# Patient Record
Sex: Male | Born: 1955 | Race: White | Hispanic: No | Marital: Single | Smoking: Former smoker
Health system: Southern US, Community
[De-identification: ages and names within clinical notes are randomized; demographics above are authoritative.]

---

## 2003-03-14 ENCOUNTER — Encounter: Payer: Self-pay | Admitting: Emergency Medicine

## 2003-03-14 ENCOUNTER — Emergency Department (HOSPITAL_COMMUNITY): Admission: EM | Admit: 2003-03-14 | Discharge: 2003-03-14 | Payer: Self-pay | Admitting: Emergency Medicine

## 2009-12-05 ENCOUNTER — Emergency Department (HOSPITAL_COMMUNITY): Admission: EM | Admit: 2009-12-05 | Discharge: 2009-12-05 | Payer: Self-pay | Admitting: Emergency Medicine

## 2011-02-22 ENCOUNTER — Emergency Department (HOSPITAL_COMMUNITY)
Admission: EM | Admit: 2011-02-22 | Discharge: 2011-02-22 | Disposition: A | Discharge status: Home / Self Care | Payer: Self-pay | Attending: Emergency Medicine | Admitting: Emergency Medicine

## 2011-02-22 DIAGNOSIS — R300 Dysuria: Secondary | ICD-10-CM | POA: Insufficient documentation

## 2011-02-22 DIAGNOSIS — R350 Frequency of micturition: Secondary | ICD-10-CM | POA: Insufficient documentation

## 2011-02-22 DIAGNOSIS — N419 Inflammatory disease of prostate, unspecified: Secondary | ICD-10-CM | POA: Insufficient documentation

## 2011-02-22 LAB — URINALYSIS, ROUTINE W REFLEX MICROSCOPIC
Glucose, UA: NEGATIVE mg/dL
Ketones, ur: NEGATIVE mg/dL
Leukocytes, UA: NEGATIVE
Nitrite: NEGATIVE
Protein, ur: NEGATIVE mg/dL
Specific Gravity, Urine: 1.032 — ABNORMAL HIGH (ref 1.005–1.030)

## 2011-04-08 ENCOUNTER — Emergency Department: Payer: Self-pay | Admitting: Emergency Medicine

## 2011-08-03 ENCOUNTER — Emergency Department (HOSPITAL_COMMUNITY)
Admission: EM | Admit: 2011-08-03 | Discharge: 2011-08-03 | Disposition: A | Discharge status: Home / Self Care | Payer: Self-pay | Attending: Emergency Medicine | Admitting: Emergency Medicine

## 2011-08-03 ENCOUNTER — Encounter: Payer: Self-pay | Admitting: *Deleted

## 2011-08-03 DIAGNOSIS — R221 Localized swelling, mass and lump, neck: Secondary | ICD-10-CM | POA: Insufficient documentation

## 2011-08-03 DIAGNOSIS — K047 Periapical abscess without sinus: Secondary | ICD-10-CM | POA: Insufficient documentation

## 2011-08-03 DIAGNOSIS — K089 Disorder of teeth and supporting structures, unspecified: Secondary | ICD-10-CM | POA: Insufficient documentation

## 2011-08-03 DIAGNOSIS — R6884 Jaw pain: Secondary | ICD-10-CM | POA: Insufficient documentation

## 2011-08-03 DIAGNOSIS — R220 Localized swelling, mass and lump, head: Secondary | ICD-10-CM | POA: Insufficient documentation

## 2011-08-03 MED ORDER — IBUPROFEN 800 MG PO TABS: 800.0000 mg | ORAL_TABLET | Freq: Three times a day (TID) | ORAL | Status: AC

## 2011-08-03 MED ORDER — HYDROCODONE-ACETAMINOPHEN 7.5-500 MG/15ML PO SOLN: 7.5000 mL | Freq: Three times a day (TID) | ORAL | Status: AC | PRN

## 2011-08-03 MED ORDER — PENICILLIN V POTASSIUM 500 MG PO TABS: 500.0000 mg | ORAL_TABLET | Freq: Four times a day (QID) | ORAL | Status: AC

## 2011-08-03 NOTE — ED Notes (Signed)
The pt has had tooth pain  For one week

## 2011-08-03 NOTE — ED Provider Notes (Signed)
History     CSN: [id number] Arrival date & time: 08/03/2011  1:10 AM   First MD Initiated Contact with Patient 08/03/11 [phone]      Chief Complaint  Patient presents with  . Dental Pain    (Consider location/radiation/quality/duration/timing/severity/associated sxs/prior treatment) Patient is a 55 y.o. male presenting with tooth pain. The history is provided by the patient.  Dental PainThe primary symptoms include mouth pain. Primary symptoms do not include oral lesions, headaches, fever, shortness of breath, sore throat, angioedema or cough. The symptoms began 5 to 7 days ago. The symptoms are unchanged. The symptoms are new. The symptoms occur constantly.  Additional symptoms include: dental sensitivity to temperature, gum tenderness and jaw pain. Additional symptoms do not include: purulent gums, trismus, facial swelling, trouble swallowing, pain with swallowing, dry mouth, taste disturbance, smell disturbance, drooling, ear pain, hearing loss, nosebleeds, swollen glands and fatigue. Medical issues include: smoking.   pain is located left lower incisor. Sharp in quality. Moderate in severity. Constant with no alleviating factors. Aggravated by chewing. Does not have a dentist. Is a smoker  History reviewed. No pertinent past medical history.  History reviewed. No pertinent past surgical history.  History reviewed. No pertinent family history.  History  Substance Use Topics  . Smoking status: Current Everyday Smoker  . Smokeless tobacco: Not on file  . Alcohol Use: No      Review of Systems  Constitutional: Negative for fever, chills and fatigue.  HENT: Positive for dental problem. Negative for hearing loss, ear pain, nosebleeds, sore throat, facial swelling, drooling, trouble swallowing, neck pain and neck stiffness.   Eyes: Negative for pain.  Respiratory: Negative for cough and shortness of breath.   Cardiovascular: Negative for chest pain.  Gastrointestinal: Negative for  abdominal pain.  Genitourinary: Negative for dysuria.  Musculoskeletal: Negative for back pain.  Skin: Negative for rash.  Neurological: Negative for headaches.  All other systems reviewed and are negative.    Allergies  Review of patient's allergies indicates no known allergies.  Home Medications   Current Outpatient Rx  Name Route Sig Dispense Refill  . IBUPROFEN 200 MG PO TABS Oral Take 1,200 mg by mouth every 6 (six) hours as needed. Tooth aches       BP 122/72  Pulse 56  Temp(Src) 97.9 F (36.6 C) (Oral)  Resp 16  SpO2 99%  Physical Exam  Constitutional: He is oriented to person, place, and time. He appears well-developed and well-nourished.  HENT:  Head: Normocephalic and atraumatic.       Very poor dentition. Left lower incisor tenderness to palpation. No gingival fluctuance does have some residual swelling in that area. No trismus. No facial swelling or erythema  Eyes: Conjunctivae and EOM are normal. Pupils are equal, round, and reactive to light.  Neck: Trachea normal. Neck supple. No thyromegaly present.  Cardiovascular: Normal rate, regular rhythm, S1 normal, S2 normal and normal pulses.     No systolic murmur is present   No diastolic murmur is present  Pulses:      Radial pulses are 2+ on the right side, and 2+ on the left side.  Pulmonary/Chest: Effort normal and breath sounds normal. He has no wheezes. He has no rhonchi. He has no rales. He exhibits no tenderness.  Abdominal: Soft. Normal appearance and bowel sounds are normal. There is no tenderness. There is no CVA tenderness and negative Murphy's sign.  Musculoskeletal:       BLE:s Calves nontender, no cords or erythema,  negative Homans sign  Neurological: He is alert and oriented to person, place, and time. He has normal strength. No cranial nerve deficit or sensory deficit. GCS eye subscore is 4. GCS verbal subscore is 5. GCS motor subscore is 6.  Skin: Skin is warm and dry. No rash noted. He is not  diaphoretic.  Psychiatric: His speech is normal.       Cooperative and appropriate    ED Course  Dental Date/Time: 08/03/2011 3:06 AM Performed by: Sunnie Nielsen Authorized by: Sunnie Nielsen Consent: Verbal consent obtained. Risks and benefits: risks, benefits and alternatives were discussed Consent given by: patient Patient understanding: patient states understanding of the procedure being performed Patient consent: the patient's understanding of the procedure matches consent given Procedure consent: procedure consent matches procedure scheduled Patient identity confirmed: verbally with patient Time out: Immediately prior to procedure a "time out" was called to verify the correct patient, procedure, equipment, support staff and site/side marked as required. Local anesthesia used: yes Anesthesia: local infiltration Local anesthetic: bupivacaine 0.5% without epinephrine Anesthetic total: 2 ml Patient tolerance: Patient tolerated the procedure well with no immediate complications. Comments: Pain improved and patient is much better.   (including critical care time)    MDM   Dental pain likely abscess. Dental block as above. Patient tolerated well. Dental referral provided with prescription for antibiotics. NSAIDs and short course of narcotic pain medication as needed for severe pain        Sunnie Nielsen, MD 08/03/11 [phone]

## 2012-05-19 ENCOUNTER — Encounter (HOSPITAL_COMMUNITY): Payer: Self-pay

## 2012-05-19 ENCOUNTER — Emergency Department (HOSPITAL_COMMUNITY): Payer: Self-pay

## 2012-05-19 ENCOUNTER — Emergency Department (HOSPITAL_COMMUNITY)
Admission: EM | Admit: 2012-05-19 | Discharge: 2012-05-19 | Disposition: A | Discharge status: Home / Self Care | Payer: Self-pay | Attending: Emergency Medicine | Admitting: Emergency Medicine

## 2012-05-19 DIAGNOSIS — I498 Other specified cardiac arrhythmias: Secondary | ICD-10-CM | POA: Insufficient documentation

## 2012-05-19 DIAGNOSIS — M7989 Other specified soft tissue disorders: Secondary | ICD-10-CM

## 2012-05-19 DIAGNOSIS — R21 Rash and other nonspecific skin eruption: Secondary | ICD-10-CM | POA: Insufficient documentation

## 2012-05-19 DIAGNOSIS — R001 Bradycardia, unspecified: Secondary | ICD-10-CM

## 2012-05-19 LAB — BASIC METABOLIC PANEL
BUN: 14 mg/dL (ref 6–23)
Calcium: 9.3 mg/dL (ref 8.4–10.5)
Creatinine, Ser: 0.89 mg/dL (ref 0.50–1.35)
GFR calc Af Amer: >90 mL/min (ref >90)
GFR calc non Af Amer: >90 mL/min (ref >90)
Potassium: 3.9 mEq/L (ref 3.5–5.1)

## 2012-05-19 LAB — URINALYSIS, ROUTINE W REFLEX MICROSCOPIC
Bilirubin Urine: NEGATIVE
Hgb urine dipstick: NEGATIVE
Ketones, ur: NEGATIVE mg/dL
Protein, ur: NEGATIVE mg/dL
Urobilinogen, UA: 1 mg/dL (ref 0.0–1.0)

## 2012-05-19 LAB — CBC WITH DIFFERENTIAL/PLATELET
Basophils Relative: 1 % (ref 0–1)
Eosinophils Absolute: 0.5 10*3/uL (ref 0.0–0.7)
Eosinophils Relative: 5 % (ref 0–5)
Hemoglobin: 15.6 g/dL (ref 13.0–17.0)
MCH: 32.7 pg (ref 26.0–34.0)
MCHC: 35.5 g/dL (ref 30.0–36.0)
Monocytes Absolute: 0.7 10*3/uL (ref 0.1–1.0)
Monocytes Relative: 7 % (ref 3–12)
Neutrophils Relative %: 59 % (ref 43–77)

## 2012-05-19 MED ORDER — SULFAMETHOXAZOLE-TRIMETHOPRIM 800-160 MG PO TABS: 1.0000 | ORAL_TABLET | Freq: Two times a day (BID) | ORAL | Status: DC

## 2012-05-19 MED ORDER — CEPHALEXIN 500 MG PO CAPS: 500.0000 mg | ORAL_CAPSULE | Freq: Four times a day (QID) | ORAL | Status: DC

## 2012-05-19 NOTE — ED Notes (Signed)
Pt here for bilateral leg sweling ongoing for several weeks, and complains of sore ness.

## 2012-05-19 NOTE — Progress Notes (Signed)
*  PRELIMINARY RESULTS* Vascular Ultrasound Left lower extremity venous duplex has been completed.  Preliminary findings: no evidence of DVT or baker's cyst.  Farrel Demark, RDMS, RVT  05/19/2012, 10:56 AM

## 2012-05-19 NOTE — ED Provider Notes (Signed)
History     CSN: [id number]  Arrival date & time 05/19/12  0756   First MD Initiated Contact with Patient 05/19/12 [phone]      Chief Complaint  Patient presents with  . Leg Swelling    (Consider location/radiation/quality/duration/timing/severity/associated sxs/prior treatment) HPI Comments: Allen Fernandez is a 56 y.o. Male who has had bilateral leg itching and left leg swelling for several days. He believes he might of gotten bit by something. He works in [employer]rap[employer], [employer]. He denies fever, chest pain, weakness, shortness of breath, dizziness, nausea, or vomiting. No prior history of DVT. The patient in the left leg is worse with standing.  The history is provided by the patient.    No past medical history on file.  No past surgical history on file.  No family history on file.  History  Substance Use Topics  . Smoking status: Current Every Day Smoker  . Smokeless tobacco: Not on file  . Alcohol Use: No      Review of Systems  All other systems reviewed and are negative.    Allergies  Review of patient's allergies indicates no known allergies.  Home Medications   Current Outpatient Rx  Name Route Sig Dispense Refill  . IBUPROFEN 200 MG PO TABS Oral Take 1,200 mg by mouth every 6 (six) hours as needed. Tooth aches     . CEPHALEXIN 500 MG PO CAPS Oral Take 1 capsule (500 mg total) by mouth 4 (four) times daily. 28 capsule 0  . SULFAMETHOXAZOLE-TRIMETHOPRIM 800-160 MG PO TABS Oral Take 1 tablet by mouth 2 (two) times daily. One po bid x 7 days 14 tablet 0    BP 121/70  Pulse 40  Temp 97.8 F (36.6 C) (Oral)  Resp 18  SpO2 98%  Physical Exam  Nursing note and vitals reviewed. Constitutional: He is oriented to person, place, and time. He appears well-developed and well-nourished.  HENT:  Head: Normocephalic and atraumatic.  Right Ear: External ear normal.  Left Ear: External ear normal.  Eyes: Conjunctivae normal and EOM are normal. Pupils are equal, round, and  reactive to light.  Neck: Normal range of motion and phonation normal. Neck supple.  Cardiovascular: Normal rate, regular rhythm, normal heart sounds and intact distal pulses.   Pulmonary/Chest: Effort normal and breath sounds normal. He exhibits no bony tenderness.  Abdominal: Soft. Normal appearance. There is no tenderness.  Musculoskeletal: Normal range of motion.  Neurological: He is alert and oriented to person, place, and time. He has normal strength. No cranial nerve deficit or sensory deficit. He exhibits normal muscle tone. Coordination normal.  Skin: Skin is warm, dry and intact.       Excoriated rash anterior shins bilaterally. No distinct pattern. No vesicles, no fluctuant areas. No drainage  Psychiatric: He has a normal mood and affect. His behavior is normal. Judgment and thought content normal.    ED Course  Procedures (including critical care time)    Date: 05/19/2012  Rate: 40  Rhythm: sinus bradycardia  QRS Axis: normal  Intervals: normal  ST/T Wave abnormalities: normal  Conduction Disutrbances:none  Narrative Interpretation:   Old EKG Reviewed: none available  Ambulation trial: He tolerated well without dizziness, weakness, or significant pain.  Repeat vital signs are normal except persistent bradycardia  Vascular Doppler, negative for DVT, left leg  Labs Reviewed  CBC WITH DIFFERENTIAL - Abnormal; Notable for the following:    WBC 10.6 (*)     All other components within normal limits  BASIC METABOLIC PANEL  TROPONIN I  URINALYSIS, ROUTINE W REFLEX MICROSCOPIC   Dg Chest 2 View  05/19/2012  *RADIOLOGY REPORT*  Clinical Data: Bradycardia.  Leg pain.  CHEST - 2 VIEW  Comparison: None.  Findings: The heart size is normal.  Mild interstitial coarsening is likely chronic.  The lungs are slightly hyperinflated.  No focal airspace disease is evident.  Mild degenerative changes are present within the thoracic spine.  IMPRESSION:  1.  Mild hyperinflation  suggesting early changes of emphysema. 2.  Mild diffuse interstitial coarsening is likely chronic. 3.  No acute cardiopulmonary disease.   Original Report Authenticated By: Jamesetta Orleans. MATTERN, M.D.      1. Leg swelling   2. Rash   3. Bradycardia       MDM  Nonspecific rash, both legs and nonspecific left leg. Swelling. Doubt DVT, frank cellulitis, arterial insufficiency. He has incidental cardiac has been asymptomatic. He stable for discharge with outpatient management. We'll cover with antibiotics for possible secondary rash, infection     Plan: Home Medications- Keflex, Bactrim; Home Treatments- Wound care ; Recommended follow up- PCP of choice and Cardiology in 1-2 weeks       Flint Melter, MD 05/19/12 1658

## 2012-05-19 NOTE — ED Notes (Signed)
Ambulated Patient around nursing unit.  Patient did very well.  Stated he had no dizziness or light headiness.

## 2018-03-11 ENCOUNTER — Other Ambulatory Visit: Payer: Self-pay

## 2018-03-11 ENCOUNTER — Emergency Department (HOSPITAL_COMMUNITY)
Admission: EM | Admit: 2018-03-11 | Discharge: 2018-03-11 | Disposition: A | Discharge status: Home / Self Care | Payer: Medicaid Other | Attending: Emergency Medicine | Admitting: Emergency Medicine

## 2018-03-11 ENCOUNTER — Emergency Department (HOSPITAL_COMMUNITY): Payer: Medicaid Other

## 2018-03-11 ENCOUNTER — Encounter (HOSPITAL_COMMUNITY): Payer: Self-pay

## 2018-03-11 DIAGNOSIS — N503 Cyst of epididymis: Secondary | ICD-10-CM | POA: Diagnosis not present

## 2018-03-11 DIAGNOSIS — N50819 Testicular pain, unspecified: Secondary | ICD-10-CM | POA: Diagnosis present

## 2018-03-11 DIAGNOSIS — F17210 Nicotine dependence, cigarettes, uncomplicated: Secondary | ICD-10-CM | POA: Diagnosis not present

## 2018-03-11 DIAGNOSIS — Z79899 Other long term (current) drug therapy: Secondary | ICD-10-CM | POA: Diagnosis not present

## 2018-03-11 MED ORDER — IBUPROFEN 800 MG PO TABS: 800.0000 mg | ORAL_TABLET | Freq: Three times a day (TID) | ORAL | 0 refills | Status: DC | PRN

## 2018-03-11 NOTE — Discharge Instructions (Signed)
Follow-up with Dr. Liliane ShiWinter in 1 to 2 weeks for your testicular pain

## 2018-03-11 NOTE — ED Triage Notes (Signed)
Pt c/o swelling and pain to the testicles on and off " for a long time, like years " pt also reports slight swelling on the penis ; pt denies any discharge ;denies any difficulty urinating ; pt does report having a horse " bite his penis" when he was little

## 2018-03-11 NOTE — ED Provider Notes (Signed)
[facility]   CSN: [id number] Arrival date & time: 03/11/18  [id number]     History   Chief Complaint Chief Complaint  Patient presents with  . Testicle Pain    HPI Celedonio Miyamotoommy R Mcneice is a 62 y.o. male.  Patient complains of testicular pain for another weeks.  The history is provided by the patient. No language interpreter was used.  Testicle Pain  This is a new problem. The current episode started more than 1 week ago. The problem occurs constantly. The problem has not changed since onset.Pertinent negatives include no chest pain, no abdominal pain and no headaches. Nothing aggravates the symptoms. Nothing relieves the symptoms. He has tried nothing for the symptoms. The treatment provided no relief.    History reviewed. No pertinent past medical history.  There are no active problems to display for this patient.   History reviewed. No pertinent surgical history.      Home Medications    Prior to Admission medications   Medication Sig Start Date End Date Taking? Authorizing Provider  aspirin-acetaminophen-caffeine (EXCEDRIN MIGRAINE) [phone] MG tablet Take 2 tablets by mouth every 6 (six) hours as needed for headache.   Yes [provider]  Pramox-PE-Glycerin-Petrolatum (PREPARATION H) 1-0.25-14.4-15 % CREA Place 1 application rectally as needed (hemorrhoids).   Yes [provider]  ibuprofen (ADVIL,MOTRIN) 800 MG tablet Take 1 tablet (800 mg total) by mouth every 8 (eight) hours as needed. 03/11/18   Bethann BerkshireZammit, Maksymilian Mabey, MD    Family History No family history on file.  Social History Social History   Tobacco Use  . Smoking status: Current Every Day Smoker  . Smokeless tobacco: Never Used  Substance Use Topics  . Alcohol use: No  . Drug use: Never     Allergies   Patient has no known allergies.   Review of Systems Review of Systems  Constitutional: Negative for appetite change and fatigue.    HENT: Negative for congestion, ear discharge and sinus pressure.   Eyes: Negative for discharge.  Respiratory: Negative for cough.   Cardiovascular: Negative for chest pain.  Gastrointestinal: Negative for abdominal pain and diarrhea.  Genitourinary: Positive for testicular pain. Negative for frequency and hematuria.  Musculoskeletal: Negative for back pain.  Skin: Negative for rash.  Neurological: Negative for seizures and headaches.  Psychiatric/Behavioral: Negative for hallucinations.     Physical Exam Updated Vital Signs BP (!) 115/91   Pulse (!) 50   Temp 97.9 F (36.6 C) (Oral)   Resp 18   Ht 6\' 2"  (1.88 m)   Wt 95.3 kg (210 lb)   SpO2 98%   BMI 26.96 kg/m   Physical Exam  Constitutional: He is oriented to person, place, and time. He appears well-developed.  HENT:  Head: Normocephalic.  Eyes: Conjunctivae and EOM are normal. No scleral icterus.  Neck: Neck supple. No thyromegaly present.  Cardiovascular: Normal rate and regular rhythm. Exam reveals no gallop and no friction rub.  No murmur heard. Pulmonary/Chest: No stridor. He has no wheezes. He has no rales. He exhibits no tenderness.  Abdominal: He exhibits no distension. There is no tenderness. There is no rebound.  Genitourinary:  Genitourinary Comments: Minimal tenderness to his scrotum and testicle  Musculoskeletal: Normal range of motion. He exhibits no edema.  Lymphadenopathy:    He has no cervical adenopathy.  Neurological: He is oriented to person, place, and time. He exhibits normal muscle tone. Coordination normal.  Skin: No rash noted. No erythema.  Psychiatric: He has a normal mood and affect. His behavior is normal.     ED Treatments / Results  Labs (all labs ordered are listed, but only abnormal results are displayed) Labs Reviewed - No data to display  EKG None  Radiology US Scrotum  Result Date: 03/11/2018 CLINICAL DATA:  Testicular pain and swelling, chronic problem, intermittent.  EXAM: SCROTAL ULTRASOUND DOPPLER ULTRASOUND OF THE TESTICLES TECHNIQUE: Complete ultrasound examination of the testicles, epididymis, and other scrotal structures was performed. Color and spectral Doppler ultrasound were also utilized to evaluate blood flow to the testicles. COMPARISON:  None. FINDINGS: Right testicle Measurements: 4.8 x 2.5 x 3.4 cm. Normal sonographic appearance. No mass lesion. Normal color flow. Left testicle Measurements: 4.7 x 2.4 x 2.7 cm. Normal sonographic appearance. No mass lesion. Normal color flow. Right epididymis:  Normal in size and appearance. Left epididymis: Normal except for an insignificant 3 x 4 mm epididymal cyst. Hydrocele:  None visualized. Varicocele:  None visualized. IMPRESSION: Normal morphologic exam. No significant finding. The only irregularity at all is an insignificant 3 x 4 mm epididymal cyst on the left. Electronically Signed   By: Paulina Fusi M.D.   On: 03/11/2018 10:40    Procedures Procedures (including critical care time)  Medications Ordered in ED Medications - No data to display   Initial Impression / Assessment and Plan / ED Course  I have reviewed the triage vital signs and the nursing notes.  Pertinent labs & imaging results that were available during my care of the patient were reviewed by me and considered in my medical decision making (see chart for details).     Patient with testicular pain.  Exam essentially negative.  Ultrasound negative.  Patient will be given Motrin and referred to urology  Final Clinical Impressions(s) / ED Diagnoses   Final diagnoses:  Testicular pain    ED Discharge Orders        Ordered    ibuprofen (ADVIL,MOTRIN) 800 MG tablet  Every 8 hours PRN     03/11/18 1357       Bethann Berkshire, MD 03/11/18 1400

## 2018-07-15 ENCOUNTER — Other Ambulatory Visit: Payer: Self-pay

## 2018-07-15 ENCOUNTER — Encounter: Payer: Self-pay | Admitting: Family Medicine

## 2018-07-15 ENCOUNTER — Ambulatory Visit: Payer: Medicaid Other | Attending: Family Medicine | Admitting: Family Medicine

## 2018-07-15 VITALS — BP 155/69 | HR 41 | Temp 98.0°F | Resp 16 | Ht 74.0 in | Wt 219.4 lb

## 2018-07-15 DIAGNOSIS — F17210 Nicotine dependence, cigarettes, uncomplicated: Secondary | ICD-10-CM | POA: Insufficient documentation

## 2018-07-15 DIAGNOSIS — K649 Unspecified hemorrhoids: Secondary | ICD-10-CM | POA: Insufficient documentation

## 2018-07-15 DIAGNOSIS — R21 Rash and other nonspecific skin eruption: Secondary | ICD-10-CM

## 2018-07-15 DIAGNOSIS — Z833 Family history of diabetes mellitus: Secondary | ICD-10-CM | POA: Diagnosis not present

## 2018-07-15 DIAGNOSIS — R001 Bradycardia, unspecified: Secondary | ICD-10-CM | POA: Diagnosis not present

## 2018-07-15 DIAGNOSIS — Z720 Tobacco use: Secondary | ICD-10-CM | POA: Diagnosis not present

## 2018-07-15 DIAGNOSIS — Z8249 Family history of ischemic heart disease and other diseases of the circulatory system: Secondary | ICD-10-CM | POA: Diagnosis not present

## 2018-07-15 MED ORDER — PERMETHRIN 5 % EX CREA: TOPICAL_CREAM | CUTANEOUS | 4 refills | Status: DC

## 2018-07-15 NOTE — Progress Notes (Signed)
Subjective:    Patient ID: Allen Fernandez, male    DOB: 01-01-1956, 62 y.o.   MRN: [id number]  HPI 62 year old male new to the practice.  Patient presents with a chief complaint of an itchy rash to both lower legs that has been going on for several months.  Patient is not sure of the cause of the rash and he has had no improvement in the rash with the use of over-the-counter medications for rash/itching.  Patient reports no significant past medical history other than hemorrhoids.  Patient with no known drug allergies.  Patient reports no allergies to foods or insect stings.  Patient reports that he does smoke about 1 to 1-1/2 pack/day of cigarettes and he knows that he needs to stop but has not yet decided when he wants to quit.  Patient does not drink any alcohol.  Patient states that he is single.  Patient states that he is not working and is currently on disability. (Patient seemed to be unsure of exactly why he was on disability but thinks that is possibly related to back pain/joint pain but does not have any complaint of issues with back pain or joint pain at today's visit.).  Patient reports his family history is significant for having a sister with diabetes requiring insulin use.  Patient states that his father who is now deceased as well as 2 of his sisters also had hypertension.  Patient denies any family history of cancer, heart disease or strokes.      Upon questioning, patient states that the rash did seem to start at his feet or ankles and spread up his legs.  Patient states that the itching is worse at night.  Patient does report all is well that he spends a lot of his time doing yard work.  Patient states that he uses his riding lawnmower to cut the grass at his home as well as helping with his neighbors yards as well.  On review of systems, patient denies any headaches, dizziness or lightheadedness.  On vital signs, patient with pulse of 41 and patient denies that he is ever been told in the  past that he had a slow heart rate.  Patient reports no current over-the-counter or prescription medication use.  Patient denies any shortness of breath other than what he believes to be secondary to chronic cigarette smoking.  Patient has had no recent increase in fatigue.  Patient denies any issues with chest pain and no sensation of palpitations.  Patient has had no peripheral edema.    Review of Systems  Constitutional: Negative for chills, diaphoresis, fatigue and fever.  Respiratory: Negative for cough and shortness of breath.   Cardiovascular: Negative for chest pain, palpitations and leg swelling.  Gastrointestinal: Positive for rectal pain (Patient reports that he has had some recent increase and hemorrhoidal symptoms over the past 1 to 2 weeks). Negative for abdominal pain, blood in stool and constipation.  Genitourinary: Negative for dysuria, flank pain and frequency.  Musculoskeletal: Negative for back pain and gait problem.  Skin: Positive for rash. Negative for wound.  Neurological: Negative for dizziness, syncope, light-headedness, numbness and headaches.       Objective:   Physical Exam BP (!) 155/69   Pulse (!) 41   Temp 98 F (36.7 C) (Oral)   Resp 16   Ht 6\' 2"  (1.88 m)   Wt 219 lb 6.4 oz (99.5 kg)   SpO2 100%   BMI 28.17 kg/m Vital signs and nurse's  notes reviewed General- well-nourished well-developed older male in no acute distress Neck-supple, no lymphadenopathy, no thyromegaly, no carotid bruit Cardiovascular- regular rhythm but slow rate Lungs-clear to auscultation bilaterally Abdomen-soft, nontender Back-no CVA tenderness Extremities- patient with the presence of some varicose veins on the lower extremities left greater than right below the knee.  No edema Skin- patient with excoriations on the anterior shins and below the knees and behind the knees on the bilateral lower extremities.  Patient with some dry appearing somewhat linear papules on the bilateral  shins and back of the calves/knees     Assessment & Plan:  1. Rash and nonspecific skin eruption Patient with a rash on the bilateral lower extremities that appears to be consistent with scabies.  Treatment discussed with the patient and he is aware that he needs to apply the cream to the entire skin area, let dry and wash off in 8 hours.  Patient should then repeat application in 1 week.  Patient can take over-the-counter Benadryl if needed for itching.  Patient should call or return if the rash has not resolved after treatment x2 - permethrin (ELIMITE) 5 % cream; Apply cream to the entire skin, let dry and wash off after 8 hours.  Repeat application in 1 week  Dispense: 60 g; Refill: 4  2. Bradycardia Patient with bradycardia and patient had EKG which did also show bradycardia with heart rate of 42.  Patient denies any significant shortness of breath and reports no dizziness/lightheadedness or increased fatigue.  Patient will have BMP to look for electrolyte abnormality and TSH to look for thyroid disorder which may be contributing to his bradycardia.  Patient will be referred to cardiology for further evaluation and treatment.  Patient is currently asymptomatic. - Basic Metabolic Panel - TSH - Ambulatory referral to Cardiology  3. Tobacco use Discussed smoking cessation with the patient.  Discussed with patient that there are medications both prescription and over-the-counter that can help with smoking cessation.  Patient was also made aware that he may call to schedule an appointment with the clinical pharmacist to discuss smoking cessation and modalities to help with quitting tobacco use.  Also discussed [place] quit line.  Patient states that he will look into these things once he has decided when he would like to stop smoking.  *Influenza immunization offered but declined at today's visit  An After Visit Summary was printed and given to the patient.  Return in about 2 weeks (around 07/29/2018)  for heart rate and BP, rash in 2-3 weeks.

## 2018-07-16 LAB — BASIC METABOLIC PANEL WITH GFR
BUN/Creatinine Ratio: 17 (ref 10–24)
BUN: 16 mg/dL (ref 8–27)
CO2: 22 mmol/L (ref 20–29)
Calcium: 9.4 mg/dL (ref 8.6–10.2)
Chloride: 103 mmol/L (ref 96–106)
Creatinine, Ser: 0.93 mg/dL (ref 0.76–1.27)
GFR calc Af Amer: 103 mL/min/1.73
GFR calc non Af Amer: 89 mL/min/1.73
Glucose: 94 mg/dL (ref 65–99)
Potassium: 4.4 mmol/L (ref 3.5–5.2)
Sodium: 142 mmol/L (ref 134–144)

## 2018-07-16 LAB — TSH: TSH: 0.975 u[IU]/mL (ref 0.450–4.500)

## 2018-07-17 ENCOUNTER — Telehealth (INDEPENDENT_AMBULATORY_CARE_PROVIDER_SITE_OTHER): Payer: Self-pay

## 2018-07-17 NOTE — Telephone Encounter (Signed)
-----   Message from Cain Saupeammie Fulp, MD sent at 07/16/2018  4:05 PM EST ----- Notify patient of normal BMP and normal TSH

## 2018-07-17 NOTE — Telephone Encounter (Signed)
Patient has a voicemail that has not been set up yet. Will attempt to call once more before mailing results Perris Conwell Budd PalmerS Olon Russ, CMA

## 2018-07-29 ENCOUNTER — Ambulatory Visit: Payer: Medicaid Other | Admitting: Family Medicine

## 2018-08-12 ENCOUNTER — Ambulatory Visit: Payer: Medicaid Other | Admitting: Family Medicine

## 2019-03-12 DIAGNOSIS — T1501XA Foreign body in cornea, right eye, initial encounter: Secondary | ICD-10-CM | POA: Diagnosis not present

## 2019-03-16 IMAGING — US US SCROTUM
1 series · 14 of 25 positions shown · non-contrast
Comparison: None.

CLINICAL DATA: Testicular pain and swelling, chronic problem,
intermittent.

EXAM:
SCROTAL ULTRASOUND
DOPPLER ULTRASOUND OF THE TESTICLES
TECHNIQUE: Complete ultrasound examination of the testicles, epididymis, and
other scrotal structures was performed. Color and spectral Doppler
ultrasound were also utilized to evaluate blood flow to the
testicles.

[Series 1: us scrotum · 0.06mm/px · 14 of 42 slices shown]
[im 1/42]
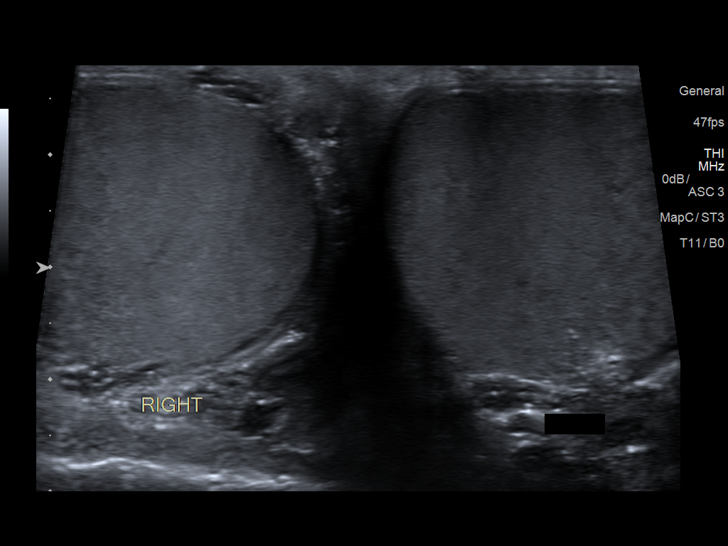
[im 4/42]
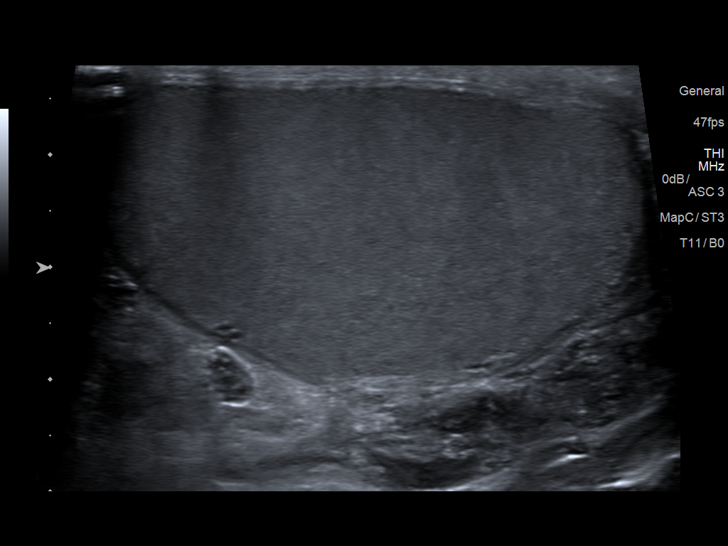
[im 7/42]
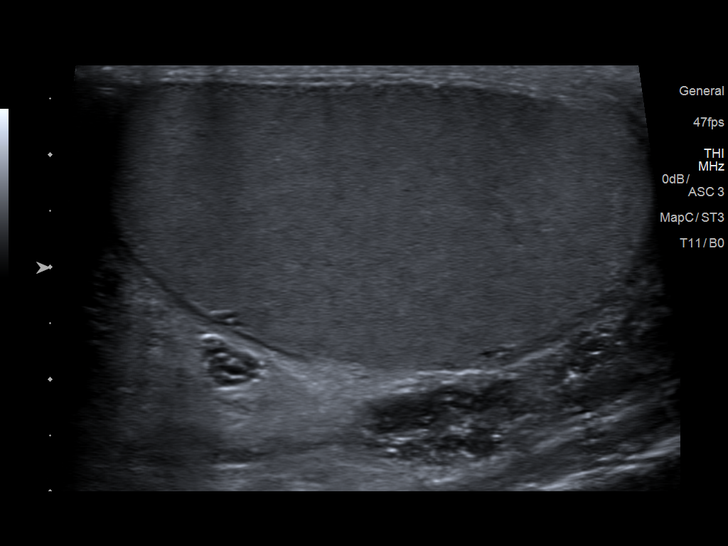
[im 11/42]
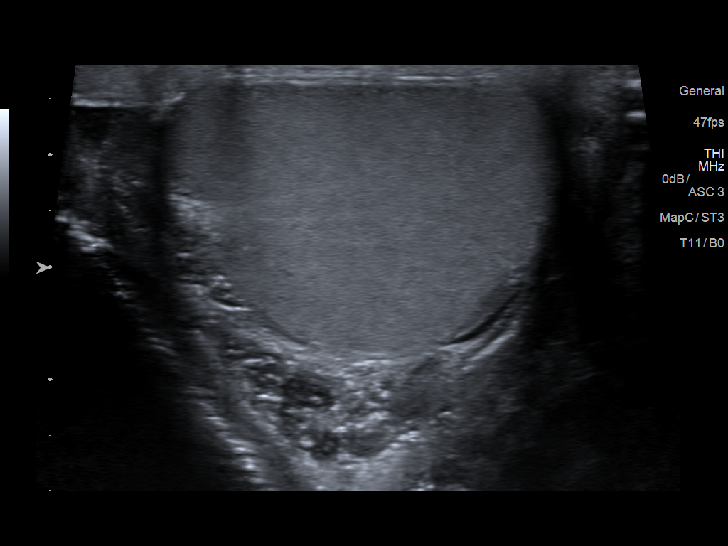
[im 14/42]
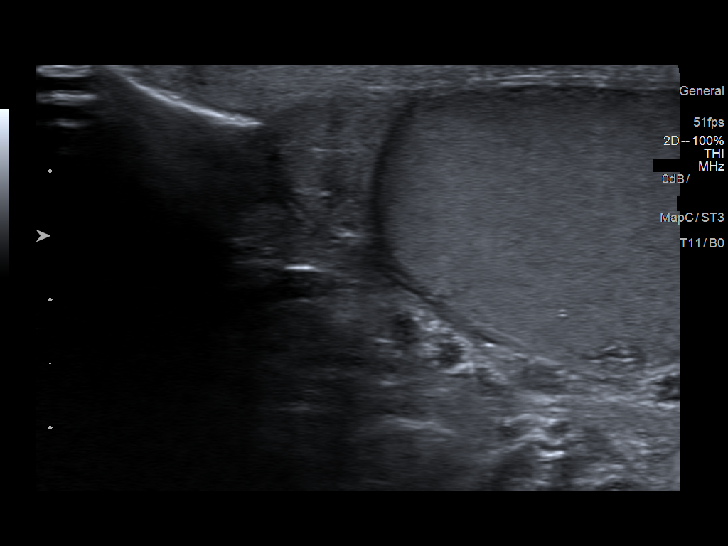
[im 16/42]
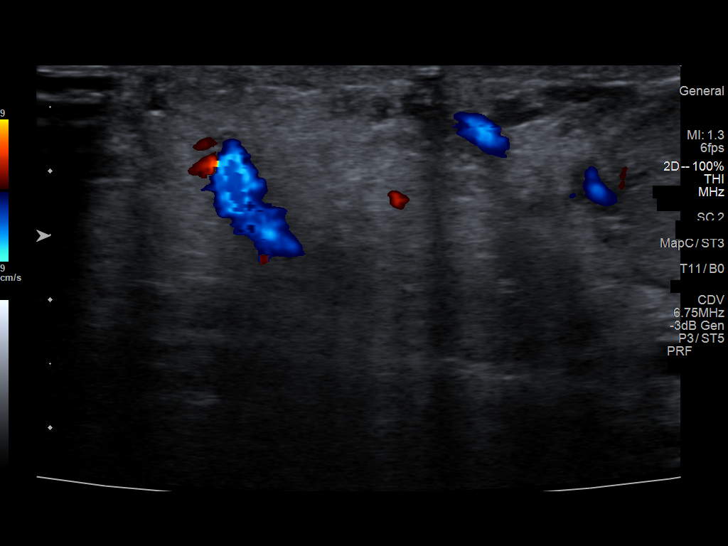
[im 19/42]
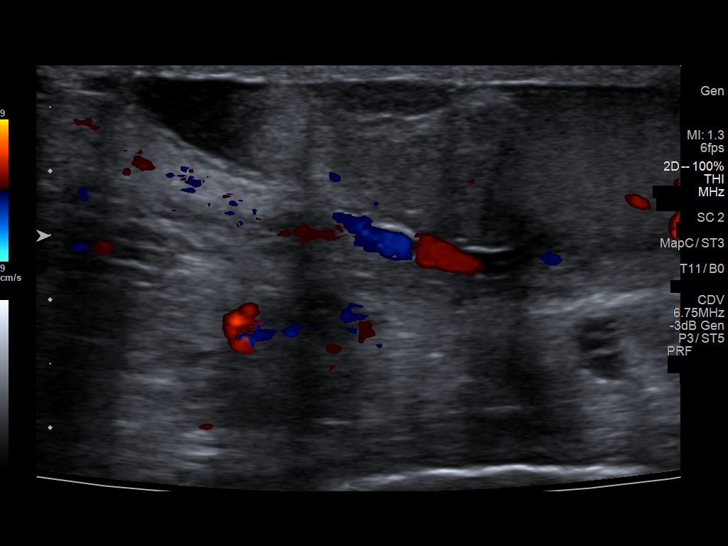
[im 23/42]
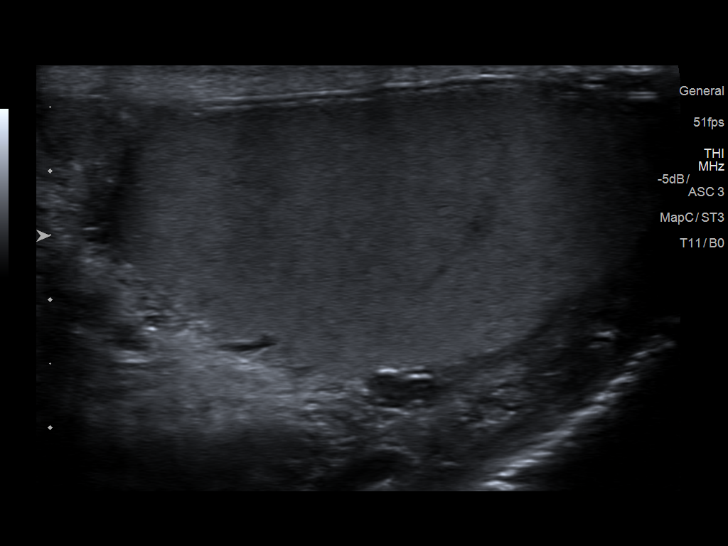
[im 26/42]
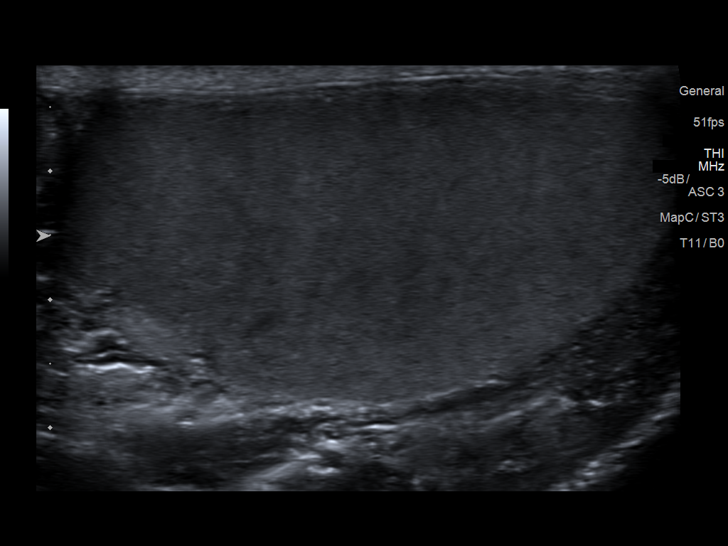
[im 28/42]
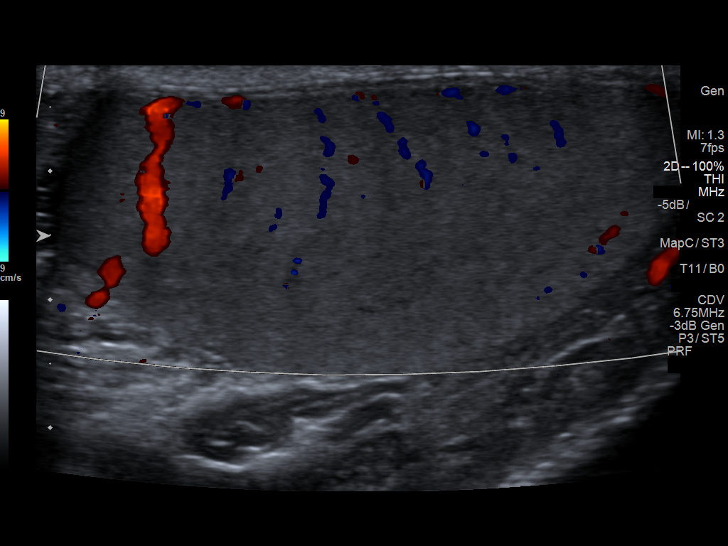
[im 31/42]
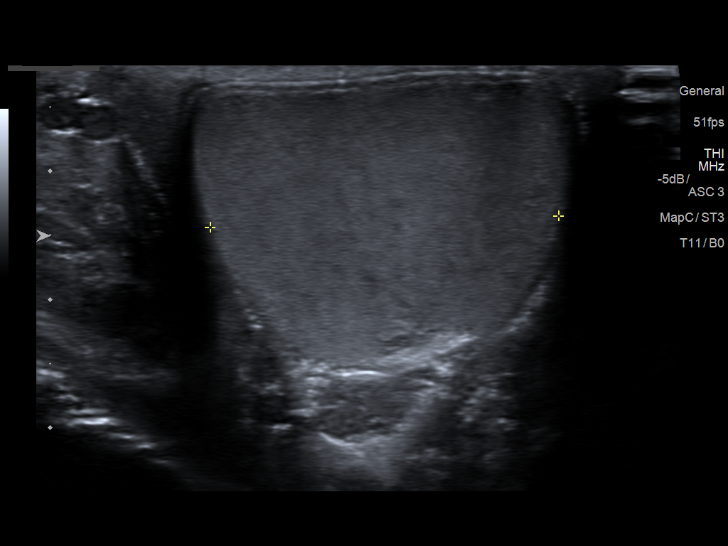
[im 35/42]
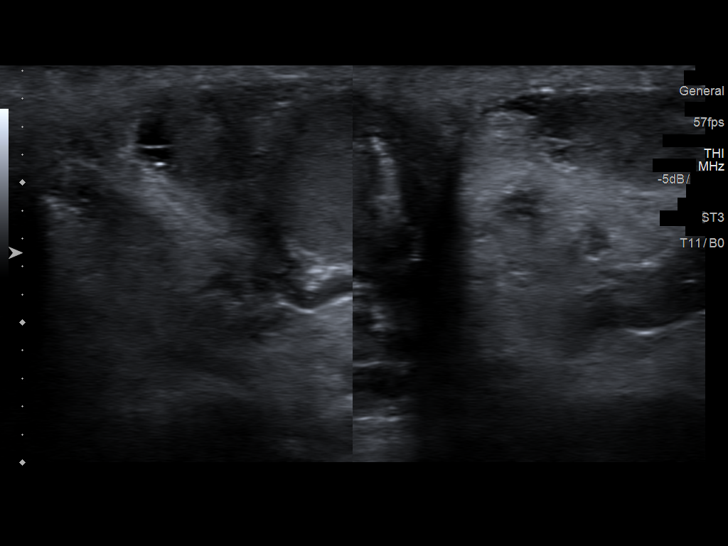
[im 38/42]
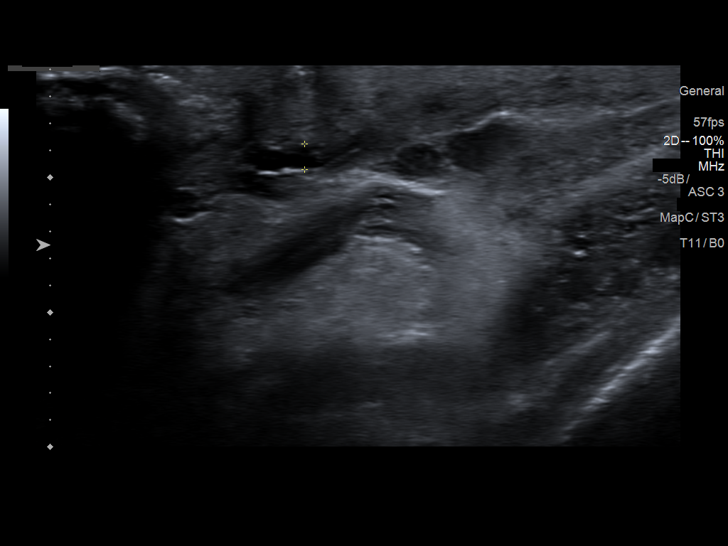
[im 42/42]
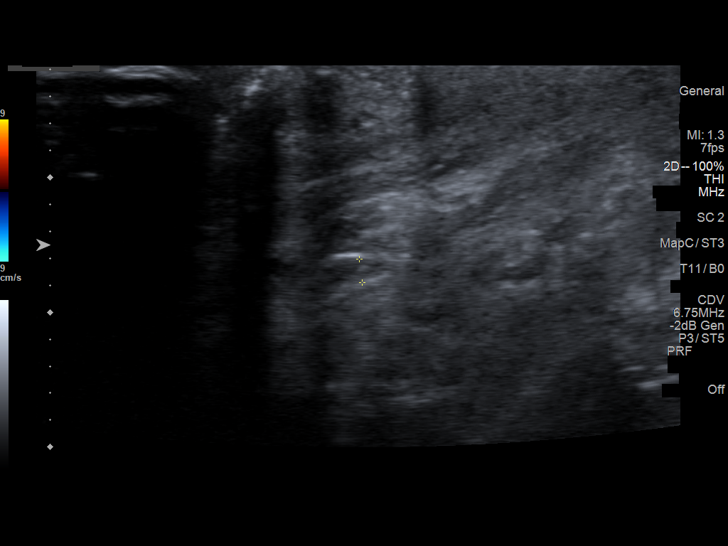

[14 of 25 positions shown; findings below may reference images not displayed]

FINDINGS: Right testicle

Measurements: 4.8 x 2.5 x 3.4 cm.. Normal sonographic appearance. No
mass lesion. Normal color flow.

Left testicle

Measurements: 4.7 x 2.4 x 2.7 cm.. Normal sonographic appearance. No
mass lesion. Normal color flow.

Right epididymis:  Normal in size and appearance.

Left epididymis: Normal except for an insignificant 3 x 4 mm
epididymal cyst.

Hydrocele:  None visualized.

Varicocele:  None visualized.
IMPRESSION: Normal morphologic exam. No significant finding. The only
irregularity at all is an insignificant 3 x 4 mm epididymal cyst on
the left.

## 2020-01-08 DIAGNOSIS — Z23 Encounter for immunization: Secondary | ICD-10-CM | POA: Diagnosis not present

## 2020-10-04 ENCOUNTER — Encounter: Payer: Self-pay | Admitting: Physician Assistant

## 2020-10-04 ENCOUNTER — Ambulatory Visit (INDEPENDENT_AMBULATORY_CARE_PROVIDER_SITE_OTHER): Payer: Medicaid Other | Admitting: Physician Assistant

## 2020-10-04 ENCOUNTER — Ambulatory Visit (INDEPENDENT_AMBULATORY_CARE_PROVIDER_SITE_OTHER): Payer: Medicaid Other

## 2020-10-04 DIAGNOSIS — M79672 Pain in left foot: Secondary | ICD-10-CM

## 2020-10-04 DIAGNOSIS — G8929 Other chronic pain: Secondary | ICD-10-CM

## 2020-10-04 NOTE — Progress Notes (Signed)
Office Visit Note   Patient: Allen Fernandez           Date of Birth: August 02, 1956           MRN: [id number] Visit Date: 10/04/2020              Requested by: No referring provider defined for this encounter. PCP: Pcp, No  No chief complaint on file.     HPI: Patient is a pleasant 65 year old gentleman with a 1 week history of left heel pain.  He denies any specific injury.  He thinks this was related to wearing a pair of shoes.  When he switched shoes it seems to get better.  He is currently wearing a pair of cowboy boots and that seems to make a big difference.  Pain is most acute in the morning sometimes when he first wakes up.  He has had no previous treatment  Assessment & Plan: Visit Diagnoses:  1. Heel pain, chronic, left     Plan: Findings consistent with plantar fasciitis.  I discussed the natural history of this as well as treatment options.  Since he seems to be doing better in his boots I continue to encourage him to wear them and perhaps add an arch support.  We also discussed Achilles stretching and I demonstrated weightbearing stretching to him as well as focus plantar fascia stretching.  I did briefly mention an injection but given this is been going on for short time I would like to try more conservative treatments first  Follow-Up Instructions: No follow-ups on file.   Ortho Exam  Patient is alert, oriented, no adenopathy, well-dressed, normal affect, normal respiratory effort. Left foot no swelling he has a strong dorsalis pedis pulse.  No tenderness with ankle range of motion though he does have some tightness in his Achilles.  No tenderness with subtalar motion.  He is focally tender over the insertion of the plantar fascia into the calcaneus.  No evidence of infection  Imaging: XR Foot Complete Left  Result Date: 10/04/2020 3 views of his foot demonstrate overall well-maintained alignment.  No acute bony changes he does have a large heel spur  No images are  attached to the encounter.  Labs: No results found for: HGBA1C, ESRSEDRATE, CRP, LABURIC, REPTSTATUS, GRAMSTAIN, CULT, LABORGA   No results found for: ALBUMIN, PREALBUMIN, LABURIC  No results found for: MG No results found for: VD25OH  No results found for: PREALBUMIN CBC EXTENDED Latest Ref Rng & Units 05/19/2012  WBC 4.0 - 10.5 K/uL 10.6(H)  RBC 4.22 - 5.81 MIL/uL 4.77  HGB 13.0 - 17.0 g/dL 16.9  HCT 67.8 - 93.8 % 44.0  PLT 150 - 400 K/uL 235  NEUTROABS 1.7 - 7.7 K/uL 6.1  LYMPHSABS 0.7 - 4.0 K/uL 2.9     There is no height or weight on file to calculate BMI.  Orders:  Orders Placed This Encounter  Procedures  . XR Foot Complete Left   No orders of the defined types were placed in this encounter.    Procedures: No procedures performed  Clinical Data: No additional findings.  ROS:  All other systems negative, except as noted in the HPI. Review of Systems  Objective: Vital Signs: There were no vitals taken for this visit.  Specialty Comments:  No specialty comments available.  PMFS History: Patient Active Problem List   Diagnosis Date Noted  . Hemorrhoids 07/15/2018  . Tobacco use 07/15/2018   No past medical history on file.  Family History  Problem Relation Age of Onset  . Hypertension Father   . Hypertension Sister   . Hypertension Sister   . Diabetes Sister     No past surgical history on file. Social History   Occupational History  . Not on file  Tobacco Use  . Smoking status: Current Every Day Smoker  . Smokeless tobacco: Never Used  Vaping Use  . Vaping Use: Never used  Substance and Sexual Activity  . Alcohol use: No  . Drug use: Never  . Sexual activity: Not Currently

## 2021-09-24 ENCOUNTER — Telehealth: Payer: Self-pay

## 2021-09-24 NOTE — Telephone Encounter (Signed)
Copied from CRM [id number]. Topic: General - Other >> Sep 24, 2021  1:10 PM Payton Spark N wrote: Reason for CRM: Pt called in stating the refer he had for his back pain, they are unable to see him until February and could not wait that long, if he could get another refer sent somewhere else. Please advise.

## 2021-09-26 ENCOUNTER — Other Ambulatory Visit: Payer: Self-pay

## 2021-09-26 ENCOUNTER — Encounter (HOSPITAL_COMMUNITY): Payer: Self-pay | Admitting: Emergency Medicine

## 2021-09-26 ENCOUNTER — Ambulatory Visit (HOSPITAL_COMMUNITY)
Admission: EM | Admit: 2021-09-26 | Discharge: 2021-09-26 | Disposition: A | Discharge status: Home / Self Care | Payer: Medicaid Other | Attending: Family Medicine | Admitting: Family Medicine

## 2021-09-26 DIAGNOSIS — M545 Low back pain, unspecified: Secondary | ICD-10-CM | POA: Diagnosis not present

## 2021-09-26 MED ORDER — PREDNISONE 10 MG PO TABS: ORAL_TABLET | ORAL | 0 refills | Status: AC

## 2021-09-26 NOTE — ED Triage Notes (Signed)
PT reports bilateral hip pain - burning sensation.  Lower back pain as well.  Denies injury.  Also notes he has diarrhea this AM, has gone 3 times.

## 2021-09-26 NOTE — Discharge Instructions (Addendum)
As we discussed, your back pain may be related to some arthritis changes and possible irritation of a disc or nerve.  We will treat you with a prednisone Dosepak.  Do not take any Advil, Aleve, ibuprofen while you are taking this.  I have also given you some stretches to do.  If any of the stretches cause worsening of your pain, do not do them.  If you are not improving over the next 1 to 2 weeks, please give the sports medicine office a call to schedule an appointment.  Stay well-hydrated with your diarrhea.  If this worsens over the next few days, especially if you are unable to tolerate anything by mouth, if you are urinating less than 50% of normal, or if you have very bad abdominal pain, you should be seen at the emergency room.  Go to the emergency room immediately if you have numbness and tingling in your groin, you are unable to urinate, you have new weakness on one side of your body or the other.  Go to [email or web address] to find a primary care provider.

## 2021-09-26 NOTE — ED Provider Notes (Signed)
[facility]    CSN: [id number] Arrival date & time: 09/26/21  1020      History   Chief Complaint Chief Complaint  Patient presents with   Hip Pain   Back Pain    HPI Allen Fernandez is a 66 y.o. male.   Back Pain/Bilateral Hip Pain Started a few days ago Taking ibuprofen without improvement No injury Hurts to lay down and to get up Radiates to bilateral hips and feels like a burning Doesn't feel tender to touch No N/T No saddle anesthesias, changes in bladder habits Is having diarrhea, but reports sphincter control No leg weakness No prior history of back pain  Diarrhea Started this AM Has gone 3 times so far Looks a little watery, no blood Urinating normally No abdominal pain No vomiting Otherwise feeling well Able to eat without difficulty No fevers   History reviewed. No pertinent past medical history.  Patient Active Problem List   Diagnosis Date Noted   Hemorrhoids 07/15/2018   Tobacco use 07/15/2018    History reviewed. No pertinent surgical history.     Home Medications    Prior to Admission medications   Medication Sig Start Date End Date Taking? Authorizing Provider  aspirin-acetaminophen-caffeine (EXCEDRIN MIGRAINE) [phone] MG tablet Take 2 tablets by mouth every 6 (six) hours as needed for headache.    [provider]  ibuprofen (ADVIL,MOTRIN) 800 MG tablet Take 1 tablet (800 mg total) by mouth every 8 (eight) hours as needed. 03/11/18   Bethann BerkshireZammit, Joseph, MD  permethrin (ELIMITE) 5 % cream Apply cream to the entire skin, let dry and wash off after 8 hours.  Repeat application in 1 week 07/15/18   Fulp, Cammie, MD  Pramox-PE-Glycerin-Petrolatum 1-0.25-14.4-15 % CREA Place 1 application rectally as needed (hemorrhoids).    [provider]    Family History Family History  Problem Relation Age of Onset   Hypertension Father    Hypertension Sister    Hypertension Sister    Diabetes Sister     Social  History Social History   Tobacco Use   Smoking status: Every Day   Smokeless tobacco: Never  Vaping Use   Vaping Use: Never used  Substance Use Topics   Alcohol use: No   Drug use: Never     Allergies   Patient has no known allergies.   Review of Systems Review of Systems  All other systems reviewed and are negative.  Per HPI Physical Exam Triage Vital Signs ED Triage Vitals  Enc Vitals Group     BP      Pulse      Resp      Temp      Temp src      SpO2      Weight      Height      Head Circumference      Peak Flow      Pain Score      Pain Loc      Pain Edu?      Excl. in GC?    No data found.  Updated Vital Signs BP (!) 143/69    Pulse 61    Temp 97.9 F (36.6 C) (Oral)    Resp 16    SpO2 97%   Visual Acuity Right Eye Distance:   Left Eye Distance:   Bilateral Distance:    Right Eye Near:   Left Eye Near:    Bilateral Near:     Physical Exam  Constitutional:      General: He is not in acute distress.    Appearance: Normal appearance. He is not ill-appearing.  HENT:     Head: Normocephalic and atraumatic.  Eyes:     Conjunctiva/sclera: Conjunctivae normal.  Cardiovascular:     Rate and Rhythm: Normal rate.  Pulmonary:     Effort: Pulmonary effort is normal. No respiratory distress.  Abdominal:     Palpations: Abdomen is soft.     Tenderness: There is no abdominal tenderness. There is no guarding.  Musculoskeletal:     Cervical back: Normal range of motion.     Comments: Lumbar spine: - Inspection: no gross deformity or asymmetry, swelling or ecchymosis - Palpation: No TTP over the spinous processes, paraspinal muscles, or SI joints b/l - ROM: full active ROM of the lumbar spine in flexion and extension with some pain with flexion - Strength: 5/5 strength of lower extremity in L4-S1 nerve root distributions b/l; normal gait - Neuro: sensation intact in the L4-S1 nerve root distribution b/l, 2+ L4 and S1 reflexes - Special testing:  positive slump b/l, Negative FABER   No pain with ROM of bilateral hips  Skin:    General: Skin is warm and dry.  Neurological:     Mental Status: He is alert and oriented to person, place, and time.  Psychiatric:        Mood and Affect: Mood normal.        Behavior: Behavior normal.     UC Treatments / Results  Labs (all labs ordered are listed, but only abnormal results are displayed) Labs Reviewed - No data to display  EKG   Radiology No results found.  Procedures Procedures (including critical care time)  Medications Ordered in UC Medications - No data to display  Initial Impression / Assessment and Plan / UC Course  I have reviewed the triage vital signs and the nursing notes.  Pertinent labs & imaging results that were available during my care of the patient were reviewed by me and considered in my medical decision making (see chart for details).     Acute low back pain without injury.  No red flags.  Can hold off on imaging in acute setting with no boney tenderness and no injury.  Will treat with prednisone dose pack.  Advised to take with food and watch for worsening of diarrhea with this.  Given McKenzie Extension exercises.  Advised f/u with sports medicine if not improving in 1-2 weeks.  Recommend hydration for diarrhea.  Benign abdominal exam and no concerning factors on history as just started today.  If unable to stay hydrated and urinating less than 50% of normal, recommended ED evaluation.  Given ED precautions, see AVS.   Final Clinical Impressions(s) / UC Diagnoses   Final diagnoses:  Acute bilateral low back pain without sciatica     Discharge Instructions      As we discussed, your back pain may be related to some arthritis changes and possible irritation of a disc or nerve.  We will treat you with a prednisone Dosepak.  Do not take any Advil, Aleve, ibuprofen while you are taking this.  I have also given you some stretches to do.  If any of the  stretches cause worsening of your pain, do not do them.  If you are not improving over the next 1 to 2 weeks, please give the sports medicine office a call to schedule an appointment.  Stay well-hydrated with your diarrhea.  If this worsens over the next few days, especially if you are unable to tolerate anything by mouth, if you are urinating less than 50% of normal, or if you have very bad abdominal pain, you should be seen at the emergency room.  Go to the emergency room immediately if you have numbness and tingling in your groin, you are unable to urinate, you have new weakness on one side of your body or the other.  Go to [email or web address] to find a primary care provider.     ED Prescriptions   None    PDMP not reviewed this encounter.   Klaus Casteneda, Solmon Ice, DO 09/26/21 1239

## 2021-09-30 ENCOUNTER — Encounter (HOSPITAL_COMMUNITY): Payer: Self-pay | Admitting: *Deleted

## 2021-09-30 ENCOUNTER — Other Ambulatory Visit: Payer: Self-pay

## 2021-09-30 ENCOUNTER — Emergency Department (HOSPITAL_COMMUNITY)
Admission: EM | Admit: 2021-09-30 | Discharge: 2021-09-30 | Disposition: A | Discharge status: Home / Self Care | Payer: Medicaid Other | Attending: Emergency Medicine | Admitting: Emergency Medicine

## 2021-09-30 DIAGNOSIS — Z79899 Other long term (current) drug therapy: Secondary | ICD-10-CM | POA: Diagnosis not present

## 2021-09-30 DIAGNOSIS — Z7982 Long term (current) use of aspirin: Secondary | ICD-10-CM | POA: Insufficient documentation

## 2021-09-30 DIAGNOSIS — M545 Low back pain, unspecified: Secondary | ICD-10-CM | POA: Diagnosis not present

## 2021-09-30 MED ORDER — KETOROLAC TROMETHAMINE 15 MG/ML IJ SOLN
15.0000 mg | Freq: Once | INTRAMUSCULAR | Status: AC
  Administered 2021-09-30: 15 mg via INTRAMUSCULAR
  Filled 2021-09-30: qty 1

## 2021-09-30 NOTE — Discharge Instructions (Signed)
Your back pain is most likely due to a muscular strain.  There is been a lot of research on back pain, unfortunately the only thing that seems to really help is Tylenol and ibuprofen.  Relative rest is also important to not lift greater than 10 pounds bending or twisting at the waist.  Please follow-up with your family physician.  The other thing that really seems to benefit patients is physical therapy which your doctor may send you for.  Please return to the emergency department for new numbness or weakness to your arms or legs. Difficulty with urinating or urinating or pooping on yourself.  Also if you cannot feel toilet paper when you wipe or get a fever.   You should not take the ibuprofen or naproxen until you have completed your steroids.  Make sure that you take the medicine with food.  Take 4 over the counter ibuprofen tablets 3 times a day or 2 over-the-counter naproxen tablets twice a day for pain. Also take tylenol 1000mg (2 extra strength) four times a day.

## 2021-09-30 NOTE — ED Provider Notes (Signed)
[facility] EMERGENCY DEPARTMENT Provider Note   CSN: [id number] Arrival date & time: 09/30/21  0856     History  Chief Complaint  Patient presents with   Back Pain    Allen Fernandez is a 66 y.o. male.  66 yo M with a chief complaints of low back pain.  This is been going on for about a week now.  Denies obvious trauma.  He thinks that his bed is gotten old and that is caused him to have the pain.  He denies fevers denies loss of bowel or bladder denies loss of rectal sensation denies numbness or weakness to the legs. Low back pain both sides, no radiation down the leg.  Worse with getting up.    The history is provided by the patient.  Back Pain     Home Medications Prior to Admission medications   Medication Sig Start Date End Date Taking? Authorizing Provider  aspirin-acetaminophen-caffeine (EXCEDRIN MIGRAINE) [phone] MG tablet Take 2 tablets by mouth every 6 (six) hours as needed for headache.    [provider]  ibuprofen (ADVIL,MOTRIN) 800 MG tablet Take 1 tablet (800 mg total) by mouth every 8 (eight) hours as needed. 03/11/18   Bethann Berkshire, MD  permethrin (ELIMITE) 5 % cream Apply cream to the entire skin, let dry and wash off after 8 hours.  Repeat application in 1 week 07/15/18   Fulp, Cammie, MD  Pramox-PE-Glycerin-Petrolatum 1-0.25-14.4-15 % CREA Place 1 application rectally as needed (hemorrhoids).    [provider]  predniSONE (DELTASONE) 10 MG tablet Take 6 tablets (60 mg total) by mouth daily for 1 day, THEN 5 tablets (50 mg total) daily for 1 day, THEN 4 tablets (40 mg total) daily for 1 day, THEN 3 tablets (30 mg total) daily for 1 day, THEN 2 tablets (20 mg total) daily for 1 day, THEN 1 tablet (10 mg total) daily for 1 day. 09/26/21 10/02/21  Meccariello, Solmon Ice, DO      Allergies    Patient has no known allergies.    Review of Systems   Review of Systems  Musculoskeletal:  Positive for back pain.   Physical  Exam Updated Vital Signs BP (!) 156/86 (BP Location: Right Arm)    Pulse (!) 49    Temp 97.7 F (36.5 C) (Temporal)    Resp 18    Ht 6\' 2"  (1.88 m)    Wt 136.1 kg    SpO2 100%    BMI 38.52 kg/m  Physical Exam Vitals and nursing note reviewed.  Constitutional:      Appearance: He is well-developed.  HENT:     Head: Normocephalic and atraumatic.  Eyes:     Pupils: Pupils are equal, round, and reactive to light.  Neck:     Vascular: No JVD.  Cardiovascular:     Rate and Rhythm: Normal rate and regular rhythm.     Heart sounds: No murmur heard.   No friction rub. No gallop.  Pulmonary:     Effort: No respiratory distress.     Breath sounds: No wheezing.  Abdominal:     General: There is no distension.     Tenderness: There is no abdominal tenderness. There is no guarding or rebound.  Musculoskeletal:        General: Tenderness present. Normal range of motion.     Cervical back: Normal range of motion and neck supple.     Comments: Mild low back pain.  PMS intact  to BLE.  Reflexes 2+ and equal.  No clonus. Skin:    Coloration: Skin is not pale.     Findings: No rash.  Neurological:     Mental Status: He is alert and oriented to person, place, and time.  Psychiatric:        Behavior: Behavior normal.    ED Results / Procedures / Treatments   Labs (all labs ordered are listed, but only abnormal results are displayed) Labs Reviewed - No data to display  EKG None  Radiology No results found.  Procedures Procedures    Medications Ordered in ED Medications  ketorolac (TORADOL) 15 MG/ML injection 15 mg (15 mg Intramuscular Given 09/30/21 0946)    ED Course/ Medical Decision Making/ A&P                           Medical Decision Making Risk Prescription drug management.   Patient is a 66 y.o. male with a cc of low back pain.  Going on for about a week.  Most likely musculoskeletal by history and physical.  He has no red flags.  Able to ambulate here without issue.   I reviewed the patient's medical record and he had a recent visit to urgent care for similar complaints.  He was started on prednisone.  Currently on steroids.  Will treat symptomatically.  Given sports medicine follow-up.  Return precautions discussed.  No current PCP..  10:09 AM:  I have discussed the diagnosis/risks/treatment options with the patient.  Evaluation and diagnostic testing in the emergency department does not suggest an emergent condition requiring admission or immediate intervention beyond what has been performed at this time.  They will follow up with  PCP, sports med. We also discussed returning to the ED immediately if new or worsening sx occur. We discussed the sx which are most concerning (e.g., sudden worsening pain, fever, inability to tolerate by mouth) that necessitate immediate return. Medications administered to the patient during their visit and any new prescriptions provided to the patient are listed below.  Medications given during this visit Medications  ketorolac (TORADOL) 15 MG/ML injection 15 mg (15 mg Intramuscular Given 09/30/21 0946)     The patient appears reasonably screen and/or stabilized for discharge and I doubt any other medical condition or other [facility] requiring further screening, evaluation, or treatment in the ED at this time prior to discharge.          Final Clinical Impression(s) / ED Diagnoses Final diagnoses:  Acute bilateral low back pain without sciatica    Rx / DC Orders ED Discharge Orders     None         Melene Plan, DO 09/30/21 1009

## 2021-09-30 NOTE — ED Triage Notes (Signed)
Patient presents to ed c/o lower back pain states the pain is much worse when he lays down. Onset 2-3 days ago. Denies radiation or tingling in arms or legs. Denies urinary sx.Patient is ambulatory.

## 2021-10-02 ENCOUNTER — Telehealth: Payer: Self-pay

## 2021-10-02 NOTE — Telephone Encounter (Signed)
Transition Care Management Follow-up Telephone Call Date of discharge and from where: 09/30/2021-[place]  How have you been since you were released from the hospital? Pt stated he is a little sore but will call to schedule appointment with PCP that was recommended  Any questions or concerns? No  Items Reviewed: Did the pt receive and understand the discharge instructions provided? Yes  Medications obtained and verified? Yes  Other? No  Any new allergies since your discharge? No  Dietary orders reviewed? No Do you have support at home? Yes   Home Care and Equipment/Supplies: Were home health services ordered? not applicable If so, what is the name of the agency? N/A  Has the agency set up a time to come to the patient's home? not applicable Were any new equipment or medical supplies ordered?  No What is the name of the medical supply agency? N/A Were you able to get the supplies/equipment? not applicable Do you have any questions related to the use of the equipment or supplies? No  PCP office was already recommended  Functional Questionnaire: (I = Independent and D = Dependent) ADLs: I  Bathing/Dressing- I  Meal Prep- I  Eating- I  Maintaining continence- I  Transferring/Ambulation- I  Managing Meds- I  Follow up appointments reviewed:  PCP Hospital f/u appt confirmed? No   Specialist Hospital f/u appt confirmed? No   Are transportation arrangements needed? No  If their condition worsens, is the pt aware to call PCP or go to the Emergency Dept.? Yes Was the patient provided with contact information for the PCP's office or ED? Yes Was to pt encouraged to call back with questions or concerns? Yes

## 2021-10-09 ENCOUNTER — Other Ambulatory Visit: Payer: Self-pay

## 2021-10-09 ENCOUNTER — Encounter: Payer: Self-pay | Admitting: Orthopaedic Surgery

## 2021-10-09 ENCOUNTER — Ambulatory Visit (INDEPENDENT_AMBULATORY_CARE_PROVIDER_SITE_OTHER): Payer: Medicaid Other | Admitting: Orthopaedic Surgery

## 2021-10-09 ENCOUNTER — Ambulatory Visit (INDEPENDENT_AMBULATORY_CARE_PROVIDER_SITE_OTHER): Payer: Medicaid Other

## 2021-10-09 VITALS — BP 158/85 | Ht 74.0 in | Wt 300.0 lb

## 2021-10-09 DIAGNOSIS — M545 Low back pain, unspecified: Secondary | ICD-10-CM | POA: Diagnosis not present

## 2021-10-09 DIAGNOSIS — G8929 Other chronic pain: Secondary | ICD-10-CM

## 2021-10-11 NOTE — Progress Notes (Signed)
Office Visit Note   Patient: Allen Fernandez           Date of Birth: 1955/09/11           MRN: [id number] Visit Date: 10/09/2021              Requested by: No referring provider defined for this encounter. PCP: Pcp, No   Assessment & Plan: Visit Diagnoses:  1. Chronic bilateral low back pain, unspecified whether sciatica present     Plan: X-ray images were reviewed he does not recall any injury that could have resulted in the T12 compression fracture.  He has been active ridden horses in the past .  We will set him up for some physical therapy for treatment of his acute low back pain.  Recheck 6 weeks.  Follow-Up Instructions: Return in about 6 weeks (around 11/20/2021).   Orders:  Orders Placed This Encounter  Procedures   XR Lumbar Spine 2-3 Views   Ambulatory referral to Physical Therapy   No orders of the defined types were placed in this encounter.     Procedures: No procedures performed   Clinical Data: No additional findings.   Subjective: Chief Complaint  Patient presents with   Lower Back - Pain    HPI 66 year old male seen with back pain.  He was treated in the emergency room 125 129 given prednisone.  He had pain across his lower back radiates into his buttocks.  Patient has severe pain if he gets from sitting to standing.  Once he is upright he does better.  He is used Tylenol and ibuprofen.  Pain does not radiate into his back.  Denies numbness or tingling.  Prednisone gave him some relief.  No associated bowel or bladder symptoms.  Review of Systems positive for smoking.  Negative for car injuries.  No history of significant falls.  All other systems noncontributory to HPI.   Objective: Vital Signs: BP (!) 158/85    Ht 6\' 2"  (1.88 m)    Wt 300 lb (136.1 kg)    BMI 38.52 kg/m   Physical Exam Constitutional:      Appearance: He is well-developed.  HENT:     Head: Normocephalic and atraumatic.     Right Ear: External ear normal.     Left Ear:  External ear normal.  Eyes:     Pupils: Pupils are equal, round, and reactive to light.  Neck:     Thyroid: No thyromegaly.     Trachea: No tracheal deviation.  Cardiovascular:     Rate and Rhythm: Normal rate.  Pulmonary:     Effort: Pulmonary effort is normal.     Breath sounds: No wheezing.  Abdominal:     General: Bowel sounds are normal.     Palpations: Abdomen is soft.  Musculoskeletal:     Cervical back: Neck supple.  Skin:    General: Skin is warm and dry.     Capillary Refill: Capillary refill takes less than 2 seconds.  Neurological:     Mental Status: He is alert and oriented to person, place, and time.  Psychiatric:        Behavior: Behavior normal.        Thought Content: Thought content normal.        Judgment: Judgment normal.    Ortho Exam  Specialty Comments:  No specialty comments available.  Imaging: P lateral lumbar spine images are obtained and reviewed.  This shows likely chronic T12 30% compression.  No spondylolisthesis in the lumbar spine.  Disc space is maintained.  Impression: Old T12 fracture otherwise unremarkable lumbar spine radiographs.   PMFS History: Patient Active Problem List   Diagnosis Date Noted   Hemorrhoids 07/15/2018   Tobacco use 07/15/2018   No past medical history on file.  Family History  Problem Relation Age of Onset   Hypertension Father    Hypertension Sister    Hypertension Sister    Diabetes Sister     No past surgical history on file. Social History   Occupational History   Not on file  Tobacco Use   Smoking status: Every Day   Smokeless tobacco: Never  Vaping Use   Vaping Use: Never used  Substance and Sexual Activity   Alcohol use: No   Drug use: Never   Sexual activity: Not Currently

## 2021-10-22 ENCOUNTER — Encounter: Payer: Self-pay | Admitting: Physical Therapy

## 2021-10-22 ENCOUNTER — Ambulatory Visit: Payer: Medicaid Other | Attending: Orthopaedic Surgery | Admitting: Physical Therapy

## 2021-10-22 ENCOUNTER — Other Ambulatory Visit: Payer: Self-pay

## 2021-10-22 DIAGNOSIS — M62830 Muscle spasm of back: Secondary | ICD-10-CM | POA: Diagnosis present

## 2021-10-22 DIAGNOSIS — G8929 Other chronic pain: Secondary | ICD-10-CM | POA: Insufficient documentation

## 2021-10-22 DIAGNOSIS — M545 Low back pain, unspecified: Secondary | ICD-10-CM | POA: Insufficient documentation

## 2021-10-22 NOTE — Therapy (Signed)
OUTPATIENT PHYSICAL THERAPY THORACOLUMBAR EVALUATION   Patient Name: Allen Fernandez MRN: [id number] DOB:[birth year], 66 y.o., male Today's Date: 10/22/2021   PT End of Session - 10/22/21 1417     Visit Number 1    Number of Visits 7    Date for PT Re-Evaluation 12/03/21    Authorization Type [employer] MCD    PT Start Time 1417    PT Stop Time 1455    PT Time Calculation (min) 38 min    Activity Tolerance Patient tolerated treatment well    Behavior During Therapy [facility] for tasks assessed/performed             History reviewed. No pertinent past medical history. History reviewed. No pertinent surgical history. Patient Active Problem List   Diagnosis Date Noted   Hemorrhoids 07/15/2018   Tobacco use 07/15/2018    PCP: Pcp, No  REFERRING PROVIDER: Eldred Manges, MD  REFERRING DIAG: Chronic bilateral low back pain, unspecified whether sciatica present [M54.50, G89.29]  THERAPY DIAG:  Chronic bilateral low back pain without sciatica  Muscle spasm of back  ONSET DATE: couple of weeks ago  SUBJECTIVE:                                                                                                                                                                                           SUBJECTIVE STATEMENT: Pt reports the pain started a couple of weeks ago, he was laying down and the back began to spasm with on specific cause. This referred pain to the hips on both sides along the outside. Since onset the pain has improved. He reportes the pain occurs with laying down and getting up.  PERTINENT HISTORY:  N/A  PAIN:  Are you having pain? Yes NPRS scale: 1/10 Pain location: bil lateral hip and low back Pain orientation: Right, Left, and Lower  PAIN TYPE: aching and burning Pain description: intermittent  Aggravating factors: getting into/ out of bed Relieving factors: take medication,   PRECAUTIONS: None  WEIGHT BEARING RESTRICTIONS No  FALLS:  Has patient fallen in  last 6 months? No, Number of falls: 0  LIVING ENVIRONMENT: Lives with: lives with their family Lives in: House/apartment Stairs: No;  Has following equipment at home: Single point cane and Environmental consultant - 2 wheeled  OCCUPATION: on distability   PLOF: Independent  PATIENT GOALS to get better   OBJECTIVE:  **MEASURES CAPTURED AT EVALUATION UNLESS OTHERWISE NOTED BY DATE** DIAGNOSTIC FINDINGS:     X-ray 10/09/2021 Impression: Old T12 fracture otherwise unremarkable lumbar spine  radiographs.   SCREENING FOR RED FLAGS: Bowel or bladder incontinence: No  COGNITION:  Overall cognitive status: Within functional limits for tasks assessed     SENSATION:  Light touch: Appears intact  POSTURE:  Forward head position  PALPATION: TTP along bil lumbar parapsinals, and R/ L QL  LUMBARAROM/PROM  A/PROM A/PROM  10/22/2021  Flexion 80  Extension 30  Right lateral flexion 30  Left lateral flexion 30  Right rotation   Left rotation    (Blank rows = not tested)  LE AROM/PROM:  A/PROM Right 10/22/2021 Left 10/22/2021  Hip flexion    Hip extension    Hip abduction    Hip adduction    Hip internal rotation    Hip external rotation    Knee flexion    Knee extension    Ankle dorsiflexion    Ankle plantarflexion    Ankle inversion    Ankle eversion     (Blank rows = not tested)  LE MMT:  MMT Right 10/22/2021 Left 10/22/2021  Hip flexion 4/5 * 4/5 *  Hip extension    Hip abduction 4+/5 4+/5  Hip adduction 4+/5 4+/5  Hip internal rotation    Hip external rotation    Knee flexion 5/5 5/5  Knee extension 5/5 5/5  Ankle dorsiflexion    Ankle plantarflexion    Ankle inversion    Ankle eversion     (Blank rows = not tested) (*= concordant pain noted)  LUMBAR SPECIAL TESTS:    FUNCTIONAL TESTS:  5 times sit to stand: 29 secs  GAIT: Distance walked:  Assistive device utilized: None Level of assistance: Complete Independence Comments: WFL    TODAY'S TREATMENT  [facility]  Adult PT Treatment:                                                DATE: 10/22/2021  Therapeutic Exercise: LTR 1 x 15 Supine posterior pelvic tilt 1 x 5 holding 10 seconds- tacitle cues for proper form Seated anterior pelvic tilt with abdominal draw in maneuver 1 x 5 - demonstration for proper form Therapeutic Activity: Log rolling for supine to sit transition     PATIENT EDUCATION:  Education details: evaluation findings, POC, goals, HEP with proper form. Person educated: Patient Education method: Explanation, Demonstration, Verbal cues, and Handouts Education comprehension: verbalized understanding, returned demonstration, and tactile cues required   HOME EXERCISE PROGRAM: Access Code: [email or web address] URL: [email or web address] Date: 10/22/2021 Prepared by: Lulu Riding  Exercises Lower Trunk Rotation - 1 x daily - 7 x weekly - 2 sets - 10 reps Seated Anterior Pelvic Tilt - 1 x daily - 7 x weekly - 2 sets - 10 reps - 5 seconds hold Supine Anterior Pelvic Tilt - 1 x daily - 7 x weekly - 2 sets - 10 reps Supine March - 1 x daily - 7 x weekly - 2 sets - 10 reps - 5 hold Sitting to Supine Roll - 1 x daily - 7 x weekly - 2 sets - 10 reps   ASSESSMENT:  CLINICAL IMPRESSION: Patient is a 66 y.o. M who was seen today for physical therapy evaluation and treatment for low back pain starting a couple of weeks ago with no specific onset or cause, and reports the pain is improving since onset. He has functional trunk mobility as well as strength with report of concordant pain noted with resisted hip flexion in sitting.  TTP along bil lumbar paraspinals  and QL that occurred with getting up from a seated position as well as going from supine to sit. He responded well in session with seated anterior pelvic tilt and rocking to help with sit to stand which greatly reduced his pain. He would benefit from physical therapy to reduce pain, improve general mobility and maximize his function  by addressing the deficits listed.     OBJECTIVE IMPAIRMENTS decreased activity tolerance, decreased balance, decreased strength, increased muscle spasms, improper body mechanics, and pain.   ACTIVITY LIMITATIONS  positioning  .   PERSONAL FACTORS Age are also affecting patient's functional outcome.    REHAB POTENTIAL: Good  CLINICAL DECISION MAKING: Stable/uncomplicated  EVALUATION COMPLEXITY: Low   GOALS: Goals reviewed with patient? Yes  SHORT TERM GOALS:  STG Name Target Date Goal status  1 Pt to be IND with initial HEP for therapeutic progression Baseline: no previous HEP 11/12/2021 INITIAL   LONG TERM GOALS:   LTG Name Target Date Goal status  1 Pt to be able to demo efficient posture and lifting mechanics to reduce abnormal tension in the low back Baseline: no previous knowledge of postre 12/03/2021 INITIAL  2 Pt to be able to perform general bed mobility via log roll and sit to stand biomechanics with </= 2/10 max pain for improvement in mobility / function Baseline: 12/03/2021 INITIAL  3 Pt to be able to perform 5 x STS scoring </= 18 seconds to demo improvement in function Baseline: 29 seconds on evaluation  12/03/2021 INITIAL  4 Pt to be IND with all HEP to be able to maintain and progress his current LOF IND Baseline: no previous HEP 12/03/2021 INITIAL   PLAN: PT FREQUENCY: 1-2x/week  PT DURATION: 6 weeks  PLANNED INTERVENTIONS: Therapeutic exercises, Therapeutic activity, Neuro Muscular re-education, Balance training, Gait training, Patient/Family education, Joint mobilization, Aquatic Therapy, Dry Needling, Electrical stimulation, Cryotherapy, Moist heat, Taping, Traction, Ultrasound, Ionotophoresis 4mg /ml Dexamethasone, and Manual therapy  PLAN FOR NEXT SESSION: Review/ update HEP PRN. Reivew posture and lifting mechanics, STW along lumbar paraspinals, core strengthening.    Deicy Rusk PT, DPT, LAT, ATC  10/22/21  3:03 PM      Check all possible  CPT codes: 10/24/21- Therapeutic Exercise, 97112- Neuro Re-education, 97140 - Manual Therapy, 97530 - Therapeutic Activities, 97535 - Self Care, [phone] - Mechanical traction, 97014 - Electrical stimulation (unattended), 11914 - Iontophoresis, 97035 - Ultrasound, [id number] - Physical performance training, and [id number] - Aquatic therapy

## 2021-10-29 ENCOUNTER — Ambulatory Visit: Payer: Medicaid Other | Admitting: Physical Therapy

## 2021-10-29 ENCOUNTER — Other Ambulatory Visit: Payer: Self-pay

## 2021-10-29 ENCOUNTER — Encounter: Payer: Self-pay | Admitting: Physical Therapy

## 2021-10-29 DIAGNOSIS — M545 Low back pain, unspecified: Secondary | ICD-10-CM | POA: Diagnosis not present

## 2021-10-29 DIAGNOSIS — M62830 Muscle spasm of back: Secondary | ICD-10-CM

## 2021-10-29 DIAGNOSIS — G8929 Other chronic pain: Secondary | ICD-10-CM

## 2021-10-29 NOTE — Therapy (Signed)
OUTPATIENT PHYSICAL THERAPY TREATMENT NOTE   Patient Name: Allen Fernandez MRN: [id number] DOB:01/08/1956, 66 y.o., male Today's Date: 10/29/2021  PCP: Merryl Hacker, No REFERRING PROVIDER: Marybelle Killings, MD   PT End of Session - 10/29/21 1028     Visit Number 2    Number of Visits 7    Date for PT Re-Evaluation 12/03/21    Authorization Type [employer] MCD    PT Start Time 1020    PT Stop Time 1100    PT Time Calculation (min) 40 min             History reviewed. No pertinent past medical history. History reviewed. No pertinent surgical history. Patient Active Problem List   Diagnosis Date Noted   Hemorrhoids 07/15/2018   Tobacco use 07/15/2018  PCP: Pcp, No   REFERRING PROVIDER: Marybelle Killings, MD   REFERRING DIAG: Chronic bilateral low back pain, unspecified whether sciatica present [M54.50, G89.29]  THERAPY DIAG:  Chronic bilateral low back pain without sciatica  Muscle spasm of back  PERTINENT HISTORY: N/A  PRECAUTIONS: none  SUBJECTIVE: I am doing a lot better. I am getting in and out of the bed now with no problem. I bend over a lot.   PAIN:  Are you having pain? No, just sore NPRS scale: 0/10 Pain location: bil lateral hip and low back Pain orientation: Right, Left, and Lower  PAIN TYPE: aching and burning Pain description: intermittent  Aggravating factors: getting into/ out of bed Relieving factors: take medication,   OBJECTIVE:  **MEASURES CAPTURED AT EVALUATION UNLESS OTHERWISE NOTED BY DATE** DIAGNOSTIC FINDINGS:     X-ray 10/09/2021 Impression: Old T12 fracture otherwise unremarkable lumbar spine  radiographs.     SCREENING FOR RED FLAGS: Bowel or bladder incontinence: No   COGNITION:          Overall cognitive status: Within functional limits for tasks assessed                        SENSATION:          Light touch: Appears intact   POSTURE:  Forward head position   PALPATION: TTP along bil lumbar parapsinals, and R/ L QL   LUMBARAROM/PROM    A/PROM A/PROM  10/22/2021  Flexion 80  Extension 30  Right lateral flexion 30  Left lateral flexion 30  Right rotation    Left rotation     (Blank rows = not tested)   LE AROM/PROM:   A/PROM Right 10/22/2021 Left 10/22/2021  Hip flexion      Hip extension      Hip abduction      Hip adduction      Hip internal rotation      Hip external rotation      Knee flexion      Knee extension      Ankle dorsiflexion      Ankle plantarflexion      Ankle inversion      Ankle eversion       (Blank rows = not tested)   LE MMT:   MMT Right 10/22/2021 Left 10/22/2021  Hip flexion 4/5 * 4/5 *  Hip extension      Hip abduction 4+/5 4+/5  Hip adduction 4+/5 4+/5  Hip internal rotation      Hip external rotation      Knee flexion 5/5 5/5  Knee extension 5/5 5/5  Ankle dorsiflexion      Ankle plantarflexion  Ankle inversion      Ankle eversion       (Blank rows = not tested) (*= concordant pain noted)   LUMBAR SPECIAL TESTS:      FUNCTIONAL TESTS:  5 times sit to stand: 29 secs   GAIT: Distance walked:  Assistive device utilized: None Level of assistance: Complete Independence Comments: WFL       TODAY'S TREATMENT  [facility] Adult PT Treatment:                                                DATE: 10/22/2021   Therapeutic Exercise: Nustep L6 x 5 minutes UE/LE Sit-stand x 10 from standard mat- min cues  LTR 1 x 15- mod cues  Supine anterior pelvic tilt per HEP Supine marching with abdominal brace Supine posterior pelvic tilt 5 sec x 10 Supine- small ROM bridge x 10  Single knee to chest 30 sec x 3 each  Seated lumbar flexion stretch  Therapeutic Activity: Log rolling for supine to sit transition Lifting 10# KB from 8inch step - max cues- kyphotic posture   [facility] Adult PT Treatment:                                                DATE: 10/22/2021   Therapeutic Exercise: LTR 1 x 15 Supine posterior pelvic tilt 1 x 5 holding 10 seconds- tacitle cues for proper  form Seated anterior pelvic tilt with abdominal draw in maneuver 1 x 5 - demonstration for proper form Therapeutic Activity: Log rolling for supine to sit transition         PATIENT EDUCATION:  Education details: evaluation findings, POC, goals, HEP with proper form. Person educated: Patient Education method: Explanation, Demonstration, Verbal cues, and Handouts Education comprehension: verbalized understanding, returned demonstration, and tactile cues required     HOME EXERCISE PROGRAM: Access Code: [email or web address] URL: [email or web address] Date: 10/29/2021 Prepared by: Hessie Diener  Exercises Lower Trunk Rotation - 1 x daily - 7 x weekly - 2 sets - 10 reps Seated Anterior Pelvic Tilt - 1 x daily - 7 x weekly - 2 sets - 10 reps - 5 seconds hold Supine Anterior Pelvic Tilt - 1 x daily - 7 x weekly - 2 sets - 10 reps Supine March - 1 x daily - 7 x weekly - 2 sets - 10 reps - 5 hold Sitting to Supine Roll - 1 x daily - 7 x weekly - 2 sets - 10 reps Hooklying Single Knee to Chest Stretch - 1 x daily - 7 x weekly - 1 sets - 3 reps - 30 hold Sit to stand with control - 1 x daily - 7 x weekly - 1 sets - 10 reps      ASSESSMENT:   CLINICAL IMPRESSION: Pt reports significant improvement since starting his HEP. He requires mod cues for HEP correctness. Progressed with sit-stand, bridge and SKTC. He has difficulty bending over to lift light kettle bell with good mechanics. He does better with sit-stand. He had increased pain with full bridge but was able to perform small ROM bridge comfortably. Upon sitting up he reported increased lumbar soreness. This was relieved by seated lumbar flexion stretching. Sit-stands and SKTC was added to  HEP today.      OBJECTIVE IMPAIRMENTS decreased activity tolerance, decreased balance, decreased strength, increased muscle spasms, improper body mechanics, and pain.    ACTIVITY LIMITATIONS  positioning  .    PERSONAL FACTORS Age are also  affecting patient's functional outcome.      REHAB POTENTIAL: Good   CLINICAL DECISION MAKING: Stable/uncomplicated   EVALUATION COMPLEXITY: Low     GOALS: Goals reviewed with patient? Yes   SHORT TERM GOALS:   STG Name Target Date Goal status  1 Pt to be IND with initial HEP for therapeutic progression Baseline: no previous HEP 11/12/2021 INITIAL    LONG TERM GOALS:    LTG Name Target Date Goal status  1 Pt to be able to demo efficient posture and lifting mechanics to reduce abnormal tension in the low back Baseline: no previous knowledge of postre 12/03/2021 INITIAL  2 Pt to be able to perform general bed mobility via log roll and sit to stand biomechanics with </= 2/10 max pain for improvement in mobility / function Baseline: 12/03/2021 INITIAL  3 Pt to be able to perform 5 x STS scoring </= 18 seconds to demo improvement in function Baseline: 29 seconds on evaluation   12/03/2021 INITIAL  4 Pt to be IND with all HEP to be able to maintain and progress his current LOF IND Baseline: no previous HEP 12/03/2021 INITIAL    PLAN: PT FREQUENCY: 1-2x/week   PT DURATION: 6 weeks   PLANNED INTERVENTIONS: Therapeutic exercises, Therapeutic activity, Neuro Muscular re-education, Balance training, Gait training, Patient/Family education, Joint mobilization, Aquatic Therapy, Dry Needling, Electrical stimulation, Cryotherapy, Moist heat, Taping, Traction, Ultrasound, Ionotophoresis 4mg /ml Dexamethasone, and Manual therapy   PLAN FOR NEXT SESSION: Review/ update HEP PRN. Reivew posture and lifting mechanics, STW along lumbar paraspinals, core strengthening.      Hessie Diener, PTA 10/29/21 12:05 PM Phone: [phone] Fax: [phone]

## 2021-11-05 ENCOUNTER — Ambulatory Visit: Payer: Medicaid Other | Attending: Orthopaedic Surgery | Admitting: Physical Therapy

## 2021-11-05 ENCOUNTER — Telehealth: Payer: Self-pay | Admitting: Physical Therapy

## 2021-11-05 NOTE — Telephone Encounter (Signed)
Attempted to contact patient regarding no -show to appointment today. His voicemail was not setup; unable to leave message.  ?

## 2021-11-12 ENCOUNTER — Telehealth: Payer: Self-pay | Admitting: Physical Therapy

## 2021-11-12 ENCOUNTER — Ambulatory Visit: Payer: Medicaid Other | Admitting: Physical Therapy

## 2021-11-12 NOTE — Telephone Encounter (Signed)
Attempted to contact patient regarding his second consecutive no-show to physical therapy. His voicemail was not set up. Due to clinic attendance policy, all future appointments are canceled.  ?

## 2021-11-19 ENCOUNTER — Encounter: Payer: Medicaid Other | Admitting: Physical Therapy

## 2021-11-20 ENCOUNTER — Ambulatory Visit: Payer: Medicaid Other | Admitting: Orthopaedic Surgery

## 2021-11-26 ENCOUNTER — Encounter: Payer: Medicaid Other | Admitting: Physical Therapy

## 2021-12-03 ENCOUNTER — Encounter: Payer: Medicaid Other | Admitting: Physical Therapy

## 2021-12-10 ENCOUNTER — Encounter: Payer: Medicaid Other | Admitting: Physical Therapy

## 2021-12-17 ENCOUNTER — Encounter: Payer: Medicaid Other | Admitting: Physical Therapy

## 2022-08-30 ENCOUNTER — Ambulatory Visit (HOSPITAL_COMMUNITY)
Admission: RE | Admit: 2022-08-30 | Discharge: 2022-08-30 | Discharge status: Against Medical Advice | Payer: Medicaid Other | Source: Ambulatory Visit

## 2024-01-08 ENCOUNTER — Ambulatory Visit (HOSPITAL_COMMUNITY)
Admission: RE | Admit: 2024-01-08 | Discharge: 2024-01-08 | Disposition: A | Discharge status: Home / Self Care | Source: Ambulatory Visit | Attending: Family Medicine | Admitting: Family Medicine

## 2024-01-08 ENCOUNTER — Encounter (HOSPITAL_COMMUNITY): Payer: Self-pay

## 2024-01-08 VITALS — BP 131/74 | HR 64 | Temp 98.1°F | Resp 16

## 2024-01-08 DIAGNOSIS — K649 Unspecified hemorrhoids: Secondary | ICD-10-CM

## 2024-01-08 MED ORDER — HYDROCORTISONE (PERIANAL) 2.5 % EX CREA: 1.0000 | TOPICAL_CREAM | Freq: Two times a day (BID) | CUTANEOUS | 0 refills | Status: DC

## 2024-01-08 NOTE — ED Triage Notes (Signed)
 Patient presenting with hemorrhoids onset 3 days ago. Ongoing for some time but flared up again recently. Denies any blood or painful bowel movements.  Prescriptions or OTC medications tried: Yes- otc hemorrhoids cream, Vaseline.    with moderate relief

## 2024-01-08 NOTE — Discharge Instructions (Signed)
 You were seen today for chronic hemorrhoids.  I have sent out a cream to use when ever you have a flare.  Please return as needed.

## 2024-01-08 NOTE — ED Provider Notes (Signed)
 [facility]    CSN: [id number] Arrival date & time: 01/08/24  0911      History   Chief Complaint Chief Complaint  Patient presents with   Appointment   Hemorrhoids    HPI Allen Fernandez is a 68 y.o. male.   Patient is here for a flare of hemorrhoids 3 days ago.  It actually feels better, but thought he should have them looked at.  He has had them a long time.   They are just sore at times.  No bleeding that his aware off.  He has used otc hemorrhoid cream without much help.        History reviewed. No pertinent past medical history.  Patient Active Problem List   Diagnosis Date Noted   Hemorrhoids 07/15/2018   Tobacco use 07/15/2018    History reviewed. No pertinent surgical history.     Home Medications    Prior to Admission medications   Medication Sig Start Date End Date Taking? Authorizing Provider  aspirin-acetaminophen -caffeine (EXCEDRIN MIGRAINE) 250-250-65 MG tablet Take 2 tablets by mouth every 6 (six) hours as needed for headache. Patient not taking: Reported on 10/22/2021    [provider]  ibuprofen  (ADVIL ,MOTRIN ) 800 MG tablet Take 1 tablet (800 mg total) by mouth every 8 (eight) hours as needed. 03/11/18   Zammit, Joseph, MD  naproxen sodium (ALEVE) 220 MG tablet Take 220 mg by mouth daily as needed.    [provider]  permethrin  (ELIMITE ) 5 % cream Apply cream to the entire skin, let dry and wash off after 8 hours.  Repeat application in 1 week Patient not taking: Reported on 10/22/2021 07/15/18   Berneda Bridges, MD  Pramox-PE-Glycerin-Petrolatum 1-0.25-14.4-15 % CREA Place 1 application rectally as needed (hemorrhoids). Patient not taking: Reported on 10/22/2021    [provider]    Family History Family History  Problem Relation Age of Onset   Hypertension Father    Hypertension Sister    Hypertension Sister    Diabetes Sister     Social History Social History   Tobacco Use   Smoking status: Every  Day   Smokeless tobacco: Never  Vaping Use   Vaping status: Never Used  Substance Use Topics   Alcohol use: No   Drug use: Never     Allergies   Patient has no known allergies.   Review of Systems Review of Systems  Constitutional: Negative.   HENT: Negative.    Respiratory: Negative.    Cardiovascular: Negative.   Gastrointestinal: Negative.      Physical Exam Triage Vital Signs ED Triage Vitals  Encounter Vitals Group     BP 01/08/24 0930 131/74     Systolic BP Percentile --      Diastolic BP Percentile --      Pulse Rate 01/08/24 0930 64     Resp 01/08/24 0930 16     Temp 01/08/24 0930 98.1 F (36.7 C)     Temp Source 01/08/24 0930 Oral     SpO2 01/08/24 0930 94 %     Weight --      Height --      Head Circumference --      Peak Flow --      Pain Score 01/08/24 0929 0     Pain Loc --      Pain Education --      Exclude from Growth Chart --    No data found.  Updated Vital Signs BP 131/74 (  BP Location: Right Arm)   Pulse 64   Temp 98.1 F (36.7 C) (Oral)   Resp 16   SpO2 94%   Visual Acuity Right Eye Distance:   Left Eye Distance:   Bilateral Distance:    Right Eye Near:   Left Eye Near:    Bilateral Near:     Physical Exam Constitutional:      Appearance: He is normal weight.  Cardiovascular:     Rate and Rhythm: Normal rate and regular rhythm.  Pulmonary:     Breath sounds: Normal breath sounds.  Genitourinary:    Comments: Patient declined chaperone.   He has a skin flap consistent with old hemorrhoid;  no active hemorrhoids currently.  No bleeding or tenderness noted.  Neurological:     General: No focal deficit present.     Mental Status: He is alert.  Psychiatric:        Mood and Affect: Mood normal.      UC Treatments / Results  Labs (all labs ordered are listed, but only abnormal results are displayed) Labs Reviewed - No data to display  EKG   Radiology No results found.  Procedures Procedures (including  critical care time)  Medications Ordered in UC Medications - No data to display  Initial Impression / Assessment and Plan / UC Course  I have reviewed the triage vital signs and the nursing notes.  Pertinent labs & imaging results that were available during my care of the patient were reviewed by me and considered in my medical decision making (see chart for details).  Final Clinical Impressions(s) / UC Diagnoses   Final diagnoses:  Hemorrhoids, unspecified hemorrhoid type     Discharge Instructions      You were seen today for chronic hemorrhoids.  I have sent out a cream to use when ever you have a flare.  Please return as needed.   ED Prescriptions     Medication Sig Dispense Auth. Provider   hydrocortisone (ANUSOL-HC) 2.5 % rectal cream Place 1 Application rectally 2 (two) times daily. 30 g Lesle Ras, MD      PDMP not reviewed this encounter.   Lesle Ras, MD 01/08/24 [phone]

## 2024-08-28 ENCOUNTER — Ambulatory Visit (HOSPITAL_COMMUNITY)
Admission: EM | Admit: 2024-08-28 | Discharge: 2024-08-28 | Disposition: A | Discharge status: Home / Self Care | Attending: Family Medicine | Admitting: Family Medicine

## 2024-08-28 ENCOUNTER — Ambulatory Visit (HOSPITAL_COMMUNITY)

## 2024-08-28 ENCOUNTER — Encounter (HOSPITAL_COMMUNITY): Payer: Self-pay

## 2024-08-28 DIAGNOSIS — R062 Wheezing: Secondary | ICD-10-CM | POA: Diagnosis not present

## 2024-08-28 DIAGNOSIS — J019 Acute sinusitis, unspecified: Secondary | ICD-10-CM | POA: Diagnosis not present

## 2024-08-28 DIAGNOSIS — J189 Pneumonia, unspecified organism: Secondary | ICD-10-CM | POA: Diagnosis not present

## 2024-08-28 DIAGNOSIS — J441 Chronic obstructive pulmonary disease with (acute) exacerbation: Secondary | ICD-10-CM

## 2024-08-28 MED ORDER — ALBUTEROL SULFATE HFA 108 (90 BASE) MCG/ACT IN AERS: 2.0000 | INHALATION_SPRAY | RESPIRATORY_TRACT | 0 refills | Status: AC | PRN

## 2024-08-28 MED ORDER — LEVOFLOXACIN 500 MG PO TABS: 500.0000 mg | ORAL_TABLET | Freq: Every day | ORAL | 0 refills | Status: AC

## 2024-08-28 MED ORDER — PREDNISONE 20 MG PO TABS: 40.0000 mg | ORAL_TABLET | Freq: Every day | ORAL | 0 refills | Status: AC

## 2024-08-28 MED ORDER — AEROCHAMBER PLUS FLO-VU MEDIUM MISC: 1.0000 | Freq: Once | Status: DC

## 2024-08-28 MED ORDER — ALBUTEROL SULFATE (2.5 MG/3ML) 0.083% IN NEBU
INHALATION_SOLUTION | RESPIRATORY_TRACT | Status: AC
  Filled 2024-08-28: qty 3

## 2024-08-28 MED ORDER — ALBUTEROL SULFATE (2.5 MG/3ML) 0.083% IN NEBU
2.5000 mg | INHALATION_SOLUTION | Freq: Once | RESPIRATORY_TRACT | Status: AC
  Administered 2024-08-28: 2.5 mg via RESPIRATORY_TRACT

## 2024-08-28 NOTE — ED Provider Notes (Addendum)
 " [facility]    CSN: [id number] Arrival date & time: 08/28/24  1017      History   Chief Complaint Chief Complaint  Patient presents with   Cough    HPI Allen Fernandez is a 68 y.o. male.    Cough  Here for nasal congestion and rhinorrhea and cough and chest congestion.  Symptoms for started about 2 weeks ago or possibly a little more.  He has had some subjective fever in the last few days.  No nausea or vomiting or diarrhea.  He has heard himself wheezing and has felt short of breath.  He has never been diagnosed with COPD or asthma and has never used an inhaler in the past.  NKDA  He is a former smoker   History reviewed. No pertinent past medical history.  Patient Active Problem List   Diagnosis Date Noted   Hemorrhoids 07/15/2018   Tobacco use 07/15/2018    History reviewed. No pertinent surgical history.     Home Medications    Prior to Admission medications  Medication Sig Start Date End Date Taking? Authorizing Provider  albuterol  (VENTOLIN  HFA) 108 (90 Base) MCG/ACT inhaler Inhale 2 puffs into the lungs every 4 (four) hours as needed for wheezing or shortness of breath. 08/28/24  Yes Vonna Sharlet POUR, MD  levofloxacin  (LEVAQUIN ) 500 MG tablet Take 1 tablet (500 mg total) by mouth daily for 7 days. 08/28/24 09/04/24 Yes Vonna Sharlet POUR, MD  predniSONE  (DELTASONE ) 20 MG tablet Take 2 tablets (40 mg total) by mouth daily with breakfast for 5 days. 08/28/24 09/02/24 Yes Vonna Sharlet POUR, MD    Family History Family History  Problem Relation Age of Onset   Hypertension Father    Hypertension Sister    Hypertension Sister    Diabetes Sister     Social History Social History[1]   Allergies   Patient has no known allergies.   Review of Systems Review of Systems  Respiratory:  Positive for cough.      Physical Exam Triage Vital Signs ED Triage Vitals  Encounter Vitals Group     BP 08/28/24 1324 (!) 153/79     Girls Systolic  BP Percentile --      Girls Diastolic BP Percentile --      Boys Systolic BP Percentile --      Boys Diastolic BP Percentile --      Pulse Rate 08/28/24 1324 66     Resp 08/28/24 1324 20     Temp 08/28/24 1324 97.9 F (36.6 C)     Temp Source 08/28/24 1324 Oral     SpO2 08/28/24 1324 92 %     Weight --      Height 08/28/24 1323 6' 2 (1.88 m)     Head Circumference --      Peak Flow --      Pain Score 08/28/24 1323 0     Pain Loc --      Pain Education --      Exclude from Growth Chart --    No data found.  Updated Vital Signs BP (!) 153/79 (BP Location: Left Arm)   Pulse 66   Temp 97.9 F (36.6 C) (Oral)   Resp 20   Ht 6' 2 (1.88 m)   SpO2 92%   BMI 38.52 kg/m   Visual Acuity Right Eye Distance:   Left Eye Distance:   Bilateral Distance:    Right Eye Near:   Left Eye  Near:    Bilateral Near:     Physical Exam Vitals (O2 sat 92% on RA) reviewed.  Constitutional:      General: He is not in acute distress.    Appearance: He is not ill-appearing, toxic-appearing or diaphoretic.  HENT:     Nose: Congestion present.     Mouth/Throat:     Mouth: Mucous membranes are moist.     Comments: There is some clear exudate in the oropharynx.  No erythema Eyes:     Extraocular Movements: Extraocular movements intact.     Conjunctiva/sclera: Conjunctivae normal.     Pupils: Pupils are equal, round, and reactive to light.  Cardiovascular:     Rate and Rhythm: Normal rate and regular rhythm.     Heart sounds: No murmur heard. Pulmonary:     Effort: No respiratory distress.     Breath sounds: No stridor. No rhonchi or rales.     Comments: There are bilateral wheezes noted, with good air movement. Musculoskeletal:     Cervical back: Neck supple.  Lymphadenopathy:     Cervical: No cervical adenopathy.  Skin:    Capillary Refill: Capillary refill takes less than 2 seconds.     Coloration: Skin is not jaundiced or pale.  Neurological:     General: No focal deficit  present.     Mental Status: He is alert and oriented to person, place, and time.  Psychiatric:        Behavior: Behavior normal.      UC Treatments / Results  Labs (all labs ordered are listed, but only abnormal results are displayed) Labs Reviewed - No data to display  EKG   Radiology No results found.  Procedures Procedures (including critical care time)  Medications Ordered in UC Medications  AeroChamber Plus Flo-Vu Medium MISC 1 each (has no administration in time range)  albuterol  (PROVENTIL ) (2.5 MG/3ML) 0.083% nebulizer solution 2.5 mg (2.5 mg Nebulization Given 08/28/24 1347)    Initial Impression / Assessment and Plan / UC Course  I have reviewed the triage vital signs and the nursing notes.  Pertinent labs & imaging results that were available during my care of the patient were reviewed by me and considered in my medical decision making (see chart for details).      He received a nebulizer treatment with albuterol .  He got some good symptom relief from the treatment and his lungs are clear afterward.  Also his O2 sat is very slightly improved to 93 or 94% from where it was before the treatment at 92%.  By my review there is a left lower lobe infiltrate, especially when compared to chest x-ray from several years ago.  He is advised of radiology over read.  Even if the radiology report does not show pneumonia as I think there is, I think he should still take the antibiotics since he has at least a sinus infection.  Levaquin  is sent in along with prednisone  and an albuterol  inhaler.  Spacer was ordered, but we are out of adult spacers for the inhalers.  Orders canceled Final Clinical Impressions(s) / UC Diagnoses   Final diagnoses:  Wheezing  Community acquired pneumonia of left lower lobe of lung  Acute sinusitis, recurrence not specified, unspecified location  COPD exacerbation (HCC)     Discharge Instructions      By my review there is a possible  pneumonia in your left lower lung. The radiologist will also read your x-ray, and if their interpretation differs significantly  from mine, and the management of your condition would change, we will call you.  Even if the radiologist does not see a pneumonia on the x-ray on the report, I think he should finish the antibiotic for sinus infection.  Albuterol  inhaler--do 2 puffs every 4 hours as needed for shortness of breath or wheezing; the medicine and the nebulizer treatment we gave you was the same as the medicine  Take prednisone  20 mg--2 daily for 5 days; this is for inflammation in your lungs  Levaquin  500 mg--take 1 tablet daily for 7 days; this is the antibiotic  I am glad you quit smoking.  Follow-up with your primary care provider  Also Mucinex dm can help for the congestion and cough       ED Prescriptions     Medication Sig Dispense Auth. Provider   albuterol  (VENTOLIN  HFA) 108 (90 Base) MCG/ACT inhaler Inhale 2 puffs into the lungs every 4 (four) hours as needed for wheezing or shortness of breath. 1 each Vonna Sharlet POUR, MD   levofloxacin  (LEVAQUIN ) 500 MG tablet Take 1 tablet (500 mg total) by mouth daily for 7 days. 7 tablet Khalifa Knecht K, MD   predniSONE  (DELTASONE ) 20 MG tablet Take 2 tablets (40 mg total) by mouth daily with breakfast for 5 days. 10 tablet Vonna Tige Meas K, MD      PDMP not reviewed this encounter.    Vonna Sharlet POUR, MD 08/28/24 1429     [1]  Social History Tobacco Use   Smoking status: Former    Types: Cigarettes   Smokeless tobacco: Never  Vaping Use   Vaping status: Never Used  Substance Use Topics   Alcohol use: No   Drug use: Never     Vonna Sharlet POUR, MD 08/28/24 1441  "

## 2024-08-28 NOTE — ED Triage Notes (Signed)
 SOB, Cough, Sneezing onset weeks. Has also been getting cramps and knots in his sides with movement. No known sick exposure. Denies history of respiratory issues.   Has tried Nyquil, Dayquil, and Advil  with no relief.

## 2024-08-28 NOTE — Discharge Instructions (Addendum)
 By my review there is a possible pneumonia in your left lower lung. The radiologist will also read your x-ray, and if their interpretation differs significantly from mine, and the management of your condition would change, we will call you.  Even if the radiologist does not see a pneumonia on the x-ray on the report, I think he should finish the antibiotic for sinus infection.  Albuterol  inhaler--do 2 puffs every 4 hours as needed for shortness of breath or wheezing; the medicine and the nebulizer treatment we gave you was the same as the medicine  Take prednisone  20 mg--2 daily for 5 days; this is for inflammation in your lungs  Levaquin  500 mg--take 1 tablet daily for 7 days; this is the antibiotic  I am glad you quit smoking.  Follow-up with your primary care provider  Also Mucinex dm can help for the congestion and cough

## 2024-08-30 ENCOUNTER — Ambulatory Visit (HOSPITAL_COMMUNITY): Payer: Self-pay
